# Patient Record
Sex: Male | Born: 1980 | Race: Black or African American | Hispanic: No | Smoking: Current every day smoker
Health system: Southern US, Community
[De-identification: ages and names within clinical notes are randomized; demographics above are authoritative.]

## PROBLEM LIST (undated history)

## (undated) DIAGNOSIS — F17200 Nicotine dependence, unspecified, uncomplicated: Secondary | ICD-10-CM

## (undated) HISTORY — DX: Nicotine dependence, unspecified, uncomplicated: F17.200

---

## 2008-02-28 ENCOUNTER — Emergency Department (HOSPITAL_COMMUNITY): Admission: EM | Admit: 2008-02-28 | Discharge: 2008-02-29 | Payer: Self-pay | Admitting: Emergency Medicine

## 2012-11-10 ENCOUNTER — Encounter (HOSPITAL_COMMUNITY): Payer: Self-pay | Admitting: Emergency Medicine

## 2012-11-10 ENCOUNTER — Emergency Department (HOSPITAL_COMMUNITY)
Admission: EM | Admit: 2012-11-10 | Discharge: 2012-11-10 | Disposition: A | Discharge status: Home / Self Care | Payer: Commercial Managed Care - PPO | Source: Home / Self Care | Attending: Emergency Medicine | Admitting: Emergency Medicine

## 2012-11-10 DIAGNOSIS — L03211 Cellulitis of face: Secondary | ICD-10-CM

## 2012-11-10 DIAGNOSIS — L0201 Cutaneous abscess of face: Secondary | ICD-10-CM

## 2012-11-10 MED ORDER — DOXYCYCLINE HYCLATE 100 MG PO CAPS: 100.0000 mg | ORAL_CAPSULE | Freq: Two times a day (BID) | ORAL | Status: AC

## 2012-11-10 NOTE — ED Notes (Signed)
Pt is here for swelling below left eye/cheek bone since yest Sx include: teary left eye and blurry vision Denies: inj/trauma, pain Has tried cold/warm compressions  He is alert w/no signs of acute distress.

## 2012-11-10 NOTE — ED Provider Notes (Signed)
History     CSN: [id number]  Arrival date & time 11/10/12  1202   First MD Initiated Contact with Patient 11/10/12 1249      Chief Complaint  Patient presents with  . Facial Swelling    (Consider location/radiation/quality/duration/timing/severity/associated sxs/prior treatment) HPI Comments: Patient presents urgent care this afternoon complaining of swelling mild degree of soreness on his left cheek bone underneath his left eye. Patient described that he had a little pimple worse he tried to squeeze yesterday and this morning woke up with swelling and irritation to his left eye. Patient denies any injury, to his eye or any redness or visual changes but did describe that his thigh was somewhat blurry yesterday. ( Not today)  The history is provided by the patient.    History reviewed. No pertinent past medical history.  History reviewed. No pertinent past surgical history.  No family history on file.  History  Substance Use Topics  . Smoking status: Current Every Day Smoker -- 1.00 packs/day    Types: Cigarettes  . Smokeless tobacco: Not on file  . Alcohol Use: Yes      Review of Systems  Constitutional: Negative for chills, diaphoresis, activity change, appetite change and fatigue.  Eyes: Negative for photophobia, pain, discharge, redness, itching and visual disturbance.  Skin: Positive for color change. Negative for rash.    Allergies  Review of patient's allergies indicates no known allergies.  Home Medications   Current Outpatient Rx  Name  Route  Sig  Dispense  Refill  . doxycycline (VIBRAMYCIN) 100 MG capsule   Oral   Take 1 capsule (100 mg total) by mouth 2 (two) times daily.   20 capsule   0     BP 116/80  Pulse 90  Temp(Src) 98.3 F (36.8 C) (Oral)  SpO2 100%  Physical Exam  Nursing note and vitals reviewed. Constitutional: He appears well-developed.  HENT:  Head: Normocephalic.    Mouth/Throat: No oropharyngeal exudate.  Eyes:  Conjunctivae and EOM are normal. Pupils are equal, round, and reactive to light. Right eye exhibits no chemosis, no discharge, no exudate and no hordeolum. No foreign body present in the right eye. Left eye exhibits no chemosis, no discharge, no exudate and no hordeolum. No foreign body present in the left eye. Right conjunctiva has no hemorrhage. Left conjunctiva is not injected. Left conjunctiva has no hemorrhage. No scleral icterus.  Neck: Neck supple. No JVD present.  Neurological: He is alert.  Skin: There is erythema.    ED Course  Procedures (including critical care time)  Labs Reviewed - No data to display No results found.   1. Facial cellulitis       MDM  Facial cellulitis secondary to initial , facial comedon- will start patient on antibiotics with doxycycline have encouraged him to return next 48-72 hours if no significant improvement. If worsening swelling or around his left eye despite antibiotics should go to the emergency department. Patient understands and agrees with treatment plan and followup as necessary.        Jimmie Molly, MD 11/10/12 [phone]

## 2017-03-03 ENCOUNTER — Encounter (HOSPITAL_COMMUNITY): Payer: Self-pay | Admitting: Emergency Medicine

## 2017-03-03 ENCOUNTER — Ambulatory Visit (HOSPITAL_COMMUNITY)
Admission: EM | Admit: 2017-03-03 | Discharge: 2017-03-03 | Disposition: A | Discharge status: Home / Self Care | Payer: Commercial Managed Care - PPO | Attending: Family Medicine | Admitting: Family Medicine

## 2017-03-03 DIAGNOSIS — F17210 Nicotine dependence, cigarettes, uncomplicated: Secondary | ICD-10-CM | POA: Diagnosis not present

## 2017-03-03 DIAGNOSIS — L02411 Cutaneous abscess of right axilla: Secondary | ICD-10-CM | POA: Diagnosis not present

## 2017-03-03 MED ORDER — HYDROCODONE-ACETAMINOPHEN 5-325 MG PO TABS: 1.0000 | ORAL_TABLET | ORAL | 0 refills | Status: DC | PRN

## 2017-03-03 MED ORDER — SULFAMETHOXAZOLE-TRIMETHOPRIM 800-160 MG PO TABS: 1.0000 | ORAL_TABLET | Freq: Two times a day (BID) | ORAL | 0 refills | Status: AC

## 2017-03-03 NOTE — ED Triage Notes (Signed)
[facility] a [facility] possible abscess under his right arm x 5 days.

## 2017-03-03 NOTE — ED Provider Notes (Signed)
CSN: [id number]     Arrival date & time 03/03/17  1734 History   First MD Initiated Contact with Patient 03/03/17 1800     Chief Complaint  Patient presents with  . Abscess   (Consider location/radiation/quality/duration/timing/severity/associated sxs/prior Treatment) The history is provided by the patient.  Abscess  Location:  Shoulder/arm Shoulder/arm abscess location:  R axilla Size:  3 cm Abscess quality: draining, painful, redness and warmth   Red streaking: no   Duration:  2 days Progression:  Worsening Pain details:    Quality:  Throbbing and pressure   Severity:  Severe   Duration:  2 days   Timing:  Constant   Progression:  Worsening Chronicity:  New Context: not diabetes, not immunosuppression, not injected drug use, not insect bite/sting and not skin injury   Relieved by:  Nothing Worsened by:  Draining/squeezing Associated symptoms: no anorexia, no fatigue, no fever, no nausea and no vomiting     History reviewed. No pertinent past medical history. History reviewed. No pertinent surgical history. History reviewed. No pertinent family history. Social History  Substance Use Topics  . Smoking status: Current Every Day Smoker    Packs/day: 1.00    Types: Cigarettes  . Smokeless tobacco: Never Used  . Alcohol use Yes    Review of Systems  Constitutional: Negative for chills, fatigue and fever.  HENT: Negative.   Respiratory: Negative.   Cardiovascular: Negative.   Gastrointestinal: Negative for abdominal pain, anorexia, nausea and vomiting.  Musculoskeletal: Negative.   Skin:       abscess  Neurological: Negative.     Allergies  Patient has no known allergies.  Home Medications   Prior to Admission medications   Medication Sig Start Date End Date Taking? Authorizing Provider  HYDROcodone-acetaminophen (NORCO/VICODIN) 5-325 MG tablet Take 1 tablet by mouth every 4 (four) hours as needed. 03/03/17   Dorena Bodo, NP  sulfamethoxazole-trimethoprim  (BACTRIM DS,SEPTRA DS) 800-160 MG tablet Take 1 tablet by mouth 2 (two) times daily. 03/03/17 03/10/17  Dorena Bodo, NP   Meds Ordered and Administered this Visit  Medications - No data to display  BP 128/73 (BP Location: Right Arm)   Pulse 66   Temp 99.2 F (37.3 C) (Oral)   Resp 16   SpO2 100%  No data found.   Physical Exam  Constitutional: He is oriented to person, place, and time. He appears well-developed and well-nourished. No distress.  HENT:  Head: Normocephalic and atraumatic.  Right Ear: External ear normal.  Left Ear: External ear normal.  Eyes: Conjunctivae are normal.  Neck: Normal range of motion.  Cardiovascular: Normal rate and regular rhythm.   Pulmonary/Chest: Effort normal and breath sounds normal.  Neurological: He is alert and oriented to person, place, and time.  Skin: Skin is warm and dry. Capillary refill takes less than 2 seconds. He is not diaphoretic.  Abscess right axilla.  Psychiatric: He has a normal mood and affect. His behavior is normal.  Nursing note and vitals reviewed.   Urgent Care Course     .Marland KitchenIncision and Drainage Date/Time: 03/03/2017 6:40 PM Performed by: Dorena Bodo Authorized by: Mardella Layman   Consent:    Consent obtained:  Verbal   Consent given by:  Patient   Risks discussed:  Bleeding, incomplete drainage, pain and infection   Alternatives discussed:  Alternative treatment and no treatment Location:    Type:  Abscess   Size:  3 cm   Location:  Trunk   Trunk location: right axilla.  Pre-procedure details:    Skin preparation:  Betadine Anesthesia (see MAR for exact dosages):    Anesthesia method:  Local infiltration   Local anesthetic:  Lidocaine 2% w/o epi Procedure type:    Complexity:  Simple Procedure details:    Incision types:  Single straight   Incision depth:  Subcutaneous   Scalpel blade:  11   Wound management:  Probed and deloculated and extensive cleaning   Drainage:  Purulent   Drainage  amount:  Moderate   Wound treatment:  Wound left open Post-procedure details:    Patient tolerance of procedure:  Tolerated well, no immediate complications   (including critical care time)  Labs Review Labs Reviewed  AEROBIC/ANAEROBIC CULTURE (SURGICAL/DEEP WOUND)    Imaging Review No results found.      MDM   1. Abscess of axilla, right    I&D performed in clinic, cultures obtained and sent for testing, patient given prescription for Bactrim, and hydrocodone. Follow-up guidelines and wound care counseling provided. Return to clinic as necessary.  [place] prior to issuing prescription, patient has had no prescriptions for controlled substances in the last 6 months.     Dorena BodoKennard, Dontel Harshberger, NP 03/03/17 [phone]

## 2017-03-03 NOTE — Discharge Instructions (Signed)
Your abscess has been drained here in the clinic today. Cultures have been obtained and sent to the lab for testing. I have started you on Bactrim, take one tablet twice a day for 7 days. I have also prescribed a medicine for pain called hydrocodone, this medicine is a narcotic, it will cause drowsiness, and it is addictive. Do not take more than what is necessary, do not drink alcohol while taking, and do not operate any heavy machinery while taking this medicine. Change your bandages at least once daily and inspect the wound. If you see any signs or symptoms of infection return to clinic as necessary.

## 2017-03-08 LAB — AEROBIC/ANAEROBIC CULTURE W GRAM STAIN (SURGICAL/DEEP WOUND)

## 2017-03-08 LAB — AEROBIC/ANAEROBIC CULTURE (SURGICAL/DEEP WOUND)

## 2018-05-14 ENCOUNTER — Encounter (HOSPITAL_COMMUNITY): Payer: Self-pay | Admitting: *Deleted

## 2018-05-14 ENCOUNTER — Ambulatory Visit (HOSPITAL_COMMUNITY)
Admission: EM | Admit: 2018-05-14 | Discharge: 2018-05-14 | Disposition: A | Discharge status: Home / Self Care | Payer: Commercial Managed Care - PPO | Attending: Internal Medicine | Admitting: Internal Medicine

## 2018-05-14 ENCOUNTER — Other Ambulatory Visit: Payer: Self-pay

## 2018-05-14 DIAGNOSIS — L0201 Cutaneous abscess of face: Secondary | ICD-10-CM | POA: Diagnosis not present

## 2018-05-14 MED ORDER — POVIDONE-IODINE 10 % EX SOLN
CUTANEOUS | Status: AC
  Filled 2018-05-14: qty 118

## 2018-05-14 MED ORDER — DOXYCYCLINE HYCLATE 100 MG PO CAPS
100.0000 mg | ORAL_CAPSULE | Freq: Two times a day (BID) | ORAL | 0 refills | Status: DC
Start: 2018-05-14 — End: 2019-06-22

## 2018-05-14 NOTE — ED Triage Notes (Signed)
Assessment per provider 

## 2018-05-14 NOTE — Discharge Instructions (Signed)
Please begin doxycycline for 10 days ° °Apply warm compresses/hot rags to area with massage to express further drainage especially the first 24-48 hours ° °Return if symptoms returning or not improving  °

## 2018-05-15 NOTE — ED Provider Notes (Signed)
[facility]    CSN: [id number] Arrival date & time: 05/14/18  1726     History   Chief Complaint Chief Complaint  Patient presents with  . Abscess    HPI Jonathon Stone is a 37 y.o. male no contributing past medical history presenting today for evaluation of abscess on his face and axilla.  Patient states that for the past 3 weeks he has had a bump on his left cheek.  He has been doing warm compresses, but states that this spot has continued to increase in size.  It is mildly painful.  He has noted some pustular drainage from this.  He also has multiple areas on both armpits that have began to bother him.  He does state that he has had previous abscesses in his armpits as well as on his face, occasionally requiring I&D, occasionally spontaneous resolution.  Denies any fevers, eye pain, difficulttymoving eyes, difficulty moving jaw, eating and drinking like normal.  HPI  No past medical history on file.  There are no active problems to display for this patient.   No past surgical history on file.     Home Medications    Prior to Admission medications   Medication Sig Start Date End Date Taking? Authorizing Provider  doxycycline (VIBRAMYCIN) 100 MG capsule Take 1 capsule (100 mg total) by mouth 2 (two) times daily. 05/14/18   Sonnie Pawloski, Junius Creamer, PA-C    Family History Family History  Problem Relation Age of Onset  . Healthy Mother   . Heart attack Father   . Cancer Maternal Grandmother   . Cancer Maternal Aunt   . Cancer Paternal Grandmother     Social History Social History   Tobacco Use  . Smoking status: Current Every Day Smoker    Packs/day: 1.00    Types: Cigarettes  . Smokeless tobacco: Never Used  Substance Use Topics  . Alcohol use: Yes  . Drug use: No     Allergies   Patient has no known allergies.   Review of Systems Review of Systems  Constitutional: Negative for fatigue and fever.  Eyes: Negative for redness, itching and visual  disturbance.  Respiratory: Negative for shortness of breath.   Cardiovascular: Negative for chest pain and leg swelling.  Gastrointestinal: Negative for nausea and vomiting.  Musculoskeletal: Negative for arthralgias and myalgias.  Skin: Positive for color change. Negative for rash and wound.  Neurological: Negative for dizziness, syncope, weakness, light-headedness and headaches.     Physical Exam Triage Vital Signs ED Triage Vitals  Enc Vitals Group     BP 05/14/18 1827 112/74     Pulse Rate 05/14/18 1827 70     Resp 05/14/18 1827 16     Temp 05/14/18 1827 98.4 F (36.9 C)     Temp Source 05/14/18 1827 Oral     SpO2 05/14/18 1827 99 %     Weight --      Height --      Head Circumference --      Peak Flow --      Pain Score 05/14/18 1854 0     Pain Loc --      Pain Edu? --      Excl. in GC? --    No data found.  Updated Vital Signs BP 112/74 (BP Location: Right Arm)   Pulse 70   Temp 98.4 F (36.9 C) (Oral)   Resp 16   SpO2 99%   Visual Acuity Right Eye Distance:  Left Eye Distance:   Bilateral Distance:    Right Eye Near:   Left Eye Near:    Bilateral Near:     Physical Exam  Constitutional: He is oriented to person, place, and time. He appears well-developed and well-nourished.  No acute distress  HENT:  Head: Normocephalic and atraumatic.  Nose: Nose normal.  Left maxillary area with 1.5 cm x 1 cm abscess-like lesion, central area appears already opened with central yellowing, mild tenderness to palpation, no pustular drainage expressed with manipulation.  Eyes: Pupils are equal, round, and reactive to light. Conjunctivae and EOM are normal.  Neck: Neck supple.  Cardiovascular: Normal rate.  Pulmonary/Chest: Effort normal. No respiratory distress.  Abdominal: He exhibits no distension.  Musculoskeletal: Normal range of motion.  Neurological: He is alert and oriented to person, place, and time.  Skin: Skin is warm and dry.  Bilateral axilla with  small indurated areas, no fluctuance appreciated  Psychiatric: He has a normal mood and affect.  Nursing note and vitals reviewed.    UC Treatments / Results  Labs (all labs ordered are listed, but only abnormal results are displayed) Labs Reviewed - No data to display  EKG None  Radiology No results found.  Procedures Incision and Drainage Date/Time: 05/15/2018 9:36 AM Performed by: Remiel Corti, Junius Creamer, PA-C Authorized by: Isa Rankin, MD   Consent:    Consent obtained:  Verbal   Consent given by:  Patient   Risks discussed:  Bleeding, incomplete drainage and pain   Alternatives discussed:  No treatment and alternative treatment Location:    Type:  Abscess   Location:  Head   Head location:  Face Pre-procedure details:    Skin preparation:  Betadine Anesthesia (see MAR for exact dosages):    Anesthesia method:  Local infiltration   Local anesthetic:  Lidocaine 2% w/o epi Procedure type:    Complexity:  Simple Procedure details:    Incision types:  Stab incision   Scalpel blade:  11   Drainage amount: No drainage. Post-procedure details:    Patient tolerance of procedure:  Tolerated well, no immediate complications Comments:     no drainage obtained, central area appeared thickened and jellylike and did not have any central drainage.   (including critical care time)  Medications Ordered in UC Medications - No data to display  Initial Impression / Assessment and Plan / UC Course  I have reviewed the triage vital signs and the nursing notes.  Pertinent labs & imaging results that were available during my care of the patient were reviewed by me and considered in my medical decision making (see chart for details).    Abscess on face appears open, I&D attempted, as lesion artery appeared opened, will put on doxycycline to treat lesion on face as well as in the axilla.  Warm compresses/soaks.  Follow-up if lesion is not improving, developing worsening  swelling or pain.Discussed strict return precautions. Patient verbalized understanding and is agreeable with plan.   Final Clinical Impressions(s) / UC Diagnoses   Final diagnoses:  Abscess of face     Discharge Instructions     Please begin doxycycline for 10 days  Apply warm compresses/hot rags to area with massage to express further drainage especially the first 24-48 hours  Return if symptoms returning or not improving   ED Prescriptions    Medication Sig Dispense Auth. Provider   doxycycline (VIBRAMYCIN) 100 MG capsule Take 1 capsule (100 mg total) by mouth 2 (two) times daily.  20 capsule Jahnessa Vanduyn C, PA-C     Controlled Substance Prescriptions [place] Controlled Substance Registry consulted? Not Applicable   Lew DawesWieters, Max Romano C, [place]-C 05/15/18 [phone]

## 2018-05-19 ENCOUNTER — Other Ambulatory Visit: Payer: Self-pay

## 2018-05-19 ENCOUNTER — Encounter (HOSPITAL_COMMUNITY): Payer: Self-pay

## 2018-05-19 ENCOUNTER — Ambulatory Visit (HOSPITAL_COMMUNITY)
Admission: EM | Admit: 2018-05-19 | Discharge: 2018-05-19 | Disposition: A | Discharge status: Home / Self Care | Payer: Commercial Managed Care - PPO | Attending: Emergency Medicine | Admitting: Emergency Medicine

## 2018-05-19 DIAGNOSIS — L03116 Cellulitis of left lower limb: Secondary | ICD-10-CM

## 2018-05-19 MED ORDER — HYDROCODONE-ACETAMINOPHEN 5-325 MG PO TABS: 1.0000 | ORAL_TABLET | Freq: Four times a day (QID) | ORAL | 0 refills | Status: DC | PRN

## 2018-05-19 MED ORDER — CLINDAMYCIN HCL 300 MG PO CAPS: 300.0000 mg | ORAL_CAPSULE | Freq: Three times a day (TID) | ORAL | 0 refills | Status: DC

## 2018-05-19 NOTE — Discharge Instructions (Addendum)
Return here in 2 days for recheck.  Sooner if needed for fever, worsening of pain or symptoms.

## 2018-05-19 NOTE — ED Provider Notes (Signed)
[facility]    CSN: [id number] Arrival date & time: 05/19/18  0848     History   Chief Complaint Chief Complaint  Patient presents with  . Knee Pain    HPI Jonathon Stone is a 37 y.o. male.   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  Patient states he is frequently running up and down the stairs.  Patient states that he had this.that showed up on his knee with significant knee pain yesterday.  Patient states he is taking Aleve without relief.  Denies injury, fall or hearing any sort of sound before onset of discomfort.  Denies fever diaphoresis or significant medical history.  HPI  History reviewed. No pertinent past medical history.  There are no active problems to display for this patient.   History reviewed. No pertinent surgical history.     Home Medications    Prior to Admission medications   Medication Sig Start Date End Date Taking? Authorizing Provider  doxycycline (VIBRAMYCIN) 100 MG capsule Take 1 capsule (100 mg total) by mouth 2 (two) times daily. 05/14/18   Wieters, Junius Creamer, PA-C    Family History Family History  Problem Relation Age of Onset  . Healthy Mother   . Heart attack Father   . Cancer Maternal Grandmother   . Cancer Maternal Aunt   . Cancer Paternal Grandmother     Social History Social History   Tobacco Use  . Smoking status: Current Every Day Smoker    Packs/day: 1.00    Types: Cigarettes  . Smokeless tobacco: Never Used  Substance Use Topics  . Alcohol use: Yes  . Drug use: No     Allergies   Patient has no known allergies.   Review of Systems Review of Systems  Constitutional: Negative.  Negative for fatigue and fever.  HENT: Negative.   Eyes: Negative.   Respiratory: Negative.  Negative for cough and shortness of breath.   Cardiovascular: Negative.   Gastrointestinal: Negative.  Negative for diarrhea, nausea and vomiting.  Endocrine: Negative.   Genitourinary: Negative.   Musculoskeletal: Positive for  arthralgias and gait problem.       L knee pain.   Skin: Negative.  Negative for wound.  Allergic/Immunologic: Negative.   Neurological: Negative for weakness and headaches.  Hematological: Negative.   Psychiatric/Behavioral: Negative.      Physical Exam Triage Vital Signs ED Triage Vitals  Enc Vitals Group     BP 05/19/18 0915 103/73     Pulse Rate 05/19/18 0915 90     Resp 05/19/18 0915 16     Temp 05/19/18 0915 98.7 F (37.1 C)     Temp src --      SpO2 05/19/18 0915 100 %     Weight 05/19/18 0916 125 lb (56.7 kg)     Height --      Head Circumference --      Peak Flow --      Pain Score 05/19/18 0915 6     Pain Loc --      Pain Edu? --      Excl. in GC? --    No data found.  Updated Vital Signs BP 103/73   Pulse 90   Temp 98.7 F (37.1 C)   Resp 16   Wt 125 lb (56.7 kg)   SpO2 100%    Physical Exam  Constitutional: He appears well-developed and well-nourished. No distress.  Cardiovascular: Normal rate, regular rhythm, normal heart sounds and intact distal  pulses. Exam reveals no gallop and no friction rub.  No murmur heard. Pulmonary/Chest: Effort normal and breath sounds normal. No respiratory distress. He has no wheezes. He has no rales. He exhibits no tenderness.  Skin: Skin is warm and dry. Capillary refill takes less than 2 seconds. He is not diaphoretic.  See attached images.  Patient has fluctuant warm area with pustule on L knee cap.   Nursing note and vitals reviewed.       Images of bilateral knees for comparison and R knee.   UC Treatments / Results  Labs (all labs ordered are listed, but only abnormal results are displayed) Labs Reviewed - No data to display  EKG None  Radiology No results found.  Procedures Procedures (including critical care time)  Medications Ordered in UC Medications - No data to display  Initial Impression / Assessment and Plan / UC Course  I have reviewed the triage vital signs and the nursing  notes.  Pertinent labs & imaging results that were available during my care of the patient were reviewed by me and considered in my medical decision making (see chart for details).     Plan of care was discussed with Dr. Hyacinth MeekerMiller.  Pustule was deroofed with a needle.  Unable to drain any fluid from spot.  She experienced significant discomfort with mild palpation.  Concern with infection over the knee joint.  Delayed of I&D due to proximity of kneecap.  Patient started on clindamycin secondary to concern for joint infection.  She is to return here in 2 days for further evaluation or sooner should symptoms fail to improve or worsen.  Final Clinical Impressions(s) / UC Diagnoses   Final diagnoses:  None   Discharge Instructions   None    ED Prescriptions    None     Controlled Substance Prescriptions [place] Controlled Substance Registry consulted? Yes, I have consulted the [place] Controlled Substances Registry for this patient, and feel the risk/benefit ratio today is favorable for proceeding with this prescription for a controlled substance.  The usual and customary discharge instructions and warnings were given.  The patient verbalizes understanding and agrees to plan of care.      Servando Salinaossi, Mccartney Brucks H, NP 05/19/18 1048

## 2018-05-19 NOTE — ED Triage Notes (Signed)
Left knee pain pt states it aches and it's swollen . This started yesterday.

## 2019-06-22 ENCOUNTER — Other Ambulatory Visit: Payer: Self-pay

## 2019-06-22 ENCOUNTER — Encounter (HOSPITAL_COMMUNITY): Payer: Self-pay

## 2019-06-22 ENCOUNTER — Ambulatory Visit (HOSPITAL_COMMUNITY)
Admission: EM | Admit: 2019-06-22 | Discharge: 2019-06-22 | Disposition: A | Discharge status: Home / Self Care | Payer: 59 | Attending: Urgent Care | Admitting: Urgent Care

## 2019-06-22 DIAGNOSIS — N50811 Right testicular pain: Secondary | ICD-10-CM | POA: Insufficient documentation

## 2019-06-22 DIAGNOSIS — N451 Epididymitis: Secondary | ICD-10-CM | POA: Diagnosis not present

## 2019-06-22 DIAGNOSIS — Z113 Encounter for screening for infections with a predominantly sexual mode of transmission: Secondary | ICD-10-CM

## 2019-06-22 LAB — POCT URINALYSIS DIP (DEVICE)
Glucose, UA: NEGATIVE mg/dL
Hgb urine dipstick: NEGATIVE
Ketones, ur: NEGATIVE mg/dL
Leukocytes,Ua: NEGATIVE
Nitrite: NEGATIVE
Protein, ur: 30 mg/dL — AB
Specific Gravity, Urine: 1.02 (ref 1.005–1.030)
Urobilinogen, UA: 1 mg/dL (ref 0.0–1.0)
pH: 8.5 — ABNORMAL HIGH (ref 5.0–8.0)

## 2019-06-22 MED ORDER — NAPROXEN 500 MG PO TABS: 500.0000 mg | ORAL_TABLET | Freq: Two times a day (BID) | ORAL | 0 refills | Status: DC

## 2019-06-22 MED ORDER — LEVOFLOXACIN 500 MG PO TABS: 500.0000 mg | ORAL_TABLET | Freq: Every day | ORAL | 0 refills | Status: DC

## 2019-06-22 MED ORDER — KETOROLAC TROMETHAMINE 60 MG/2ML IM SOLN
INTRAMUSCULAR | Status: AC
  Filled 2019-06-22: qty 2

## 2019-06-22 MED ORDER — KETOROLAC TROMETHAMINE 60 MG/2ML IM SOLN
60.0000 mg | Freq: Once | INTRAMUSCULAR | Status: AC
  Administered 2019-06-22: 60 mg via INTRAMUSCULAR

## 2019-06-22 NOTE — ED Triage Notes (Signed)
Pt states he has groin pain. Pt states he has not pulled a muscle. Pt states this started yesterday.

## 2019-06-22 NOTE — ED Provider Notes (Signed)
  MRN: [id number] DOB: 08/09/1981  Subjective:   Jonathon Stone is a 38 y.o. male presenting for 1 day hx acute onset persistent moderate-severe sharp pain of posterior right testicle. Has felt intermittent swelling. Denies fever, trauma, heavy lifting, penile discharge, dysuria, hematuria, n/v, abdominal pain. Patient is sexually active, uses condoms for protection, has 72 male sex partner, has monogamous relationship to the best of his knowledge.  Denies taking chronic medications.   No Known Allergies  Denies pmh, psh.   ROS  Objective:   Vitals: BP 106/80 (BP Location: Right Arm)   Pulse 75   Temp 98.5 F (36.9 C) (Oral)   Resp 16   Wt 125 lb (56.7 kg)   SpO2 100%   Physical Exam Constitutional:      Appearance: Normal appearance. He is well-developed and normal weight.  HENT:     Head: Normocephalic and atraumatic.     Right Ear: External ear normal.     Left Ear: External ear normal.     Nose: Nose normal.     Mouth/Throat:     Pharynx: Oropharynx is clear.  Eyes:     Extraocular Movements: Extraocular movements intact.     Pupils: Pupils are equal, round, and reactive to light.  Cardiovascular:     Rate and Rhythm: Normal rate.  Pulmonary:     Effort: Pulmonary effort is normal.  Genitourinary:    Penis: No phimosis, paraphimosis, hypospadias, erythema, tenderness, discharge, swelling or lesions.      Epididymis:     Right: Inflamed. Not enlarged. Tenderness present. No mass.     Left: Not inflamed or enlarged. No mass or tenderness.  Lymphadenopathy:     Lower Body: No right inguinal adenopathy. No left inguinal adenopathy.  Neurological:     Mental Status: He is alert and oriented to person, place, and time.  Psychiatric:        Mood and Affect: Mood normal.        Behavior: Behavior normal.     Results for orders placed or performed during the hospital encounter of 06/22/19 (from the past 24 hour(s))  POCT urinalysis dip (device)     Status: Abnormal    Collection Time: 06/22/19 10:38 AM  Result Value Ref Range   Glucose, UA NEGATIVE NEGATIVE mg/dL   Bilirubin Urine SMALL (A) NEGATIVE   Ketones, ur NEGATIVE NEGATIVE mg/dL   Specific Gravity, Urine 1.020 1.005 - 1.030   Hgb urine dipstick NEGATIVE NEGATIVE   pH 8.5 (H) 5.0 - 8.0   Protein, ur 30 (A) NEGATIVE mg/dL   Urobilinogen, UA 1.0 0.0 - 1.0 mg/dL   Nitrite NEGATIVE NEGATIVE   Leukocytes,Ua NEGATIVE NEGATIVE     Assessment and Plan :   1. Epididymitis   2. Testicular pain, right     Patient and I discussed possibility of STI but he insisted that he is in a monogamous relationship.  Will cover for epididymitis with levofloxacin x10 days.  Labs pending.  Patient is to keep close follow-up especially if he is not having any kind of improvement in the next couple of days.  Counseled patient on potential for adverse effects with medications prescribed/recommended today, ER and return-to-clinic precautions discussed, patient verbalized understanding.    Jaynee Eagles, PA-C 06/22/19 1114

## 2019-06-23 LAB — CYTOLOGY, (ORAL, ANAL, URETHRAL) ANCILLARY ONLY
Chlamydia: NEGATIVE
Neisseria Gonorrhea: NEGATIVE
Trichomonas: POSITIVE — AB

## 2019-06-23 LAB — URINE CULTURE: Culture: <10000 — AB

## 2019-06-26 ENCOUNTER — Telehealth (HOSPITAL_COMMUNITY): Payer: Self-pay | Admitting: Emergency Medicine

## 2019-06-26 MED ORDER — METRONIDAZOLE 500 MG PO TABS: 2000.0000 mg | ORAL_TABLET | Freq: Once | ORAL | 0 refills | Status: AC

## 2019-06-26 NOTE — Telephone Encounter (Signed)
Trichomonas is positive. Rx  for Flagyl 2 grams, once was sent to the pharmacy of record. Pt needs education to refrain from sexual intercourse for 7 days to give the medicine time to work. Sexual partners need to be notified and tested/treated. Condoms may reduce risk of reinfection. Recheck for further evaluation if symptoms are not improving.   Patient contacted and made aware of    results, all questions answered    

## 2020-09-21 DIAGNOSIS — L0291 Cutaneous abscess, unspecified: Secondary | ICD-10-CM

## 2020-09-21 HISTORY — DX: Cutaneous abscess, unspecified: L02.91

## 2020-10-12 ENCOUNTER — Other Ambulatory Visit: Payer: Self-pay

## 2020-10-12 ENCOUNTER — Encounter: Payer: Self-pay | Admitting: Emergency Medicine

## 2020-10-12 ENCOUNTER — Ambulatory Visit
Admission: EM | Admit: 2020-10-12 | Discharge: 2020-10-12 | Disposition: A | Discharge status: Home / Self Care | Payer: 59 | Attending: Emergency Medicine | Admitting: Emergency Medicine

## 2020-10-12 DIAGNOSIS — L0231 Cutaneous abscess of buttock: Secondary | ICD-10-CM

## 2020-10-12 MED ORDER — IBUPROFEN 800 MG PO TABS: 800.0000 mg | ORAL_TABLET | Freq: Three times a day (TID) | ORAL | 0 refills | Status: DC

## 2020-10-12 MED ORDER — DOXYCYCLINE HYCLATE 100 MG PO CAPS: 100.0000 mg | ORAL_CAPSULE | Freq: Two times a day (BID) | ORAL | 0 refills | Status: DC

## 2020-10-12 NOTE — ED Triage Notes (Signed)
Pt presents with left buttock pain x 9 days. He feels he might have been bit by a spider.

## 2020-10-12 NOTE — Discharge Instructions (Signed)
Please begin doxycycline for 10 days  Apply warm compresses/hot rags to area with massage to express further drainage especially the first 24-48 hours  Return in 2-3 days if not improving  Return if symptoms returning or not improving

## 2020-10-12 NOTE — ED Provider Notes (Signed)
[facility]    CSN: [id number] Arrival date & time: 10/12/20  1401      History   Chief Complaint Chief Complaint  Patient presents with  . Abscess    Left buttock    HPI Jonathon Stone is a 40 y.o. male presenting today for evaluation of left buttock pain.  Reports approximately 2 weeks ago was lying on floor and felt a sharp pinch in his left buttocks.  Since he has developed an area of pain swelling and redness to his left buttocks.  Reports some occasional bloody drainage.  A lot of pain with pressure on this side.  Denies fevers.  Denies history of similar.  HPI  History reviewed. No pertinent past medical history.  There are no problems to display for this patient.   History reviewed. No pertinent surgical history.     Home Medications    Prior to Admission medications   Medication Sig Start Date End Date Taking? Authorizing Provider  doxycycline (VIBRAMYCIN) 100 MG capsule Take 1 capsule (100 mg total) by mouth 2 (two) times daily for 10 days. 10/12/20 10/22/20 Yes Rosan Calbert C, PA-C  ibuprofen (ADVIL) 800 MG tablet Take 1 tablet (800 mg total) by mouth 3 (three) times daily. 10/12/20  Yes Suesan Mohrmann, Junius Creamer, PA-C    Family History Family History  Problem Relation Age of Onset  . Healthy Mother   . Heart attack Father   . Cancer Maternal Grandmother   . Cancer Maternal Aunt   . Cancer Paternal Grandmother     Social History Social History   Tobacco Use  . Smoking status: Current Every Day Smoker    Packs/day: 1.00    Types: Cigarettes  . Smokeless tobacco: Never Used  Vaping Use  . Vaping Use: Never used  Substance Use Topics  . Alcohol use: Yes  . Drug use: No     Allergies   Patient has no known allergies.   Review of Systems Review of Systems  Constitutional: Negative for fatigue and fever.  Eyes: Negative for redness, itching and visual disturbance.  Respiratory: Negative for shortness of breath.   Cardiovascular:  Negative for chest pain and leg swelling.  Gastrointestinal: Negative for nausea and vomiting.  Musculoskeletal: Negative for arthralgias and myalgias.  Skin: Positive for color change and wound. Negative for rash.  Neurological: Negative for dizziness, syncope, weakness, light-headedness and headaches.     Physical Exam Triage Vital Signs ED Triage Vitals  Enc Vitals Group     BP 10/12/20 1417 112/76     Pulse Rate 10/12/20 1417 71     Resp 10/12/20 1417 16     Temp 10/12/20 1417 98.3 F (36.8 C)     Temp Source 10/12/20 1417 Oral     SpO2 10/12/20 1417 98 %     Weight 10/12/20 1420 125 lb (56.7 kg)     Height 10/12/20 1420 5\' 9"  (1.753 m)     Head Circumference --      Peak Flow --      Pain Score 10/12/20 1420 2     Pain Loc --      Pain Edu? --      Excl. in GC? --    No data found.  Updated Vital Signs BP 112/76 (BP Location: Left Arm)   Pulse 71   Temp 98.3 F (36.8 C) (Oral)   Resp 16   Ht 5\' 9"  (1.753 m)   Wt 125 lb (56.7 kg)  SpO2 97%   BMI 18.46 kg/m   Visual Acuity Right Eye Distance:   Left Eye Distance:   Bilateral Distance:    Right Eye Near:   Left Eye Near:    Bilateral Near:     Physical Exam Vitals and nursing note reviewed.  Constitutional:      Appearance: He is well-developed and well-nourished.     Comments: No acute distress  HENT:     Head: Normocephalic and atraumatic.     Nose: Nose normal.  Eyes:     Conjunctiva/sclera: Conjunctivae normal.  Cardiovascular:     Rate and Rhythm: Normal rate.  Pulmonary:     Effort: Pulmonary effort is normal. No respiratory distress.  Abdominal:     General: There is no distension.  Musculoskeletal:        General: Normal range of motion.     Cervical back: Neck supple.  Skin:    General: Skin is warm and dry.     Comments: Left buttocks with 2 to 3 cm area of erythema and induration with central scabbing, no fluctuance noted, tenderness to palpation around area  Neurological:      Mental Status: He is alert and oriented to person, place, and time.  Psychiatric:        Mood and Affect: Mood and affect normal.      UC Treatments / Results  Labs (all labs ordered are listed, but only abnormal results are displayed) Labs Reviewed - No data to display  EKG   Radiology No results found.  Procedures Procedures (including critical care time)  Medications Ordered in UC Medications - No data to display  Initial Impression / Assessment and Plan / UC Course  I have reviewed the triage vital signs and the nursing notes.  Pertinent labs & imaging results that were available during my care of the patient were reviewed by me and considered in my medical decision making (see chart for details).     Left buttock abscess- initiated on doxycycline, warm compresses, no fluctuance noted and given central scabbing deferring I&D at this time.  Monitor for gradual resolution with antibiotics alone.  Follow-up in 2 to 3 days for I&D if not resolving.  Discussed strict return precautions. Patient verbalized understanding and is agreeable with plan.  Final Clinical Impressions(s) / UC Diagnoses   Final diagnoses:  Left buttock abscess     Discharge Instructions     Please begin doxycycline for 10 days  Apply warm compresses/hot rags to area with massage to express further drainage especially the first 24-48 hours  Return in 2-3 days if not improving  Return if symptoms returning or not improving    ED Prescriptions    Medication Sig Dispense Auth. Provider   doxycycline (VIBRAMYCIN) 100 MG capsule Take 1 capsule (100 mg total) by mouth 2 (two) times daily for 10 days. 20 capsule Byran Bilotti C, PA-C   ibuprofen (ADVIL) 800 MG tablet Take 1 tablet (800 mg total) by mouth 3 (three) times daily. 21 tablet Elize Pinon, [place] C, PA-C     PDMP not reviewed this encounter.   Lew Dawes, [place] 10/12/20 1652

## 2020-10-16 ENCOUNTER — Encounter (HOSPITAL_COMMUNITY): Payer: Self-pay | Admitting: Emergency Medicine

## 2020-10-16 ENCOUNTER — Emergency Department (HOSPITAL_COMMUNITY): Payer: 59

## 2020-10-16 ENCOUNTER — Emergency Department (HOSPITAL_COMMUNITY)
Admission: EM | Admit: 2020-10-16 | Discharge: 2020-10-16 | Disposition: A | Discharge status: Home / Self Care | Payer: 59 | Attending: Emergency Medicine | Admitting: Emergency Medicine

## 2020-10-16 DIAGNOSIS — F17210 Nicotine dependence, cigarettes, uncomplicated: Secondary | ICD-10-CM | POA: Insufficient documentation

## 2020-10-16 DIAGNOSIS — L0231 Cutaneous abscess of buttock: Secondary | ICD-10-CM | POA: Diagnosis present

## 2020-10-16 LAB — BASIC METABOLIC PANEL
Anion gap: 9 (ref 5–15)
BUN: 5 mg/dL — ABNORMAL LOW (ref 6–20)
CO2: 26 mmol/L (ref 22–32)
Calcium: 8.8 mg/dL — ABNORMAL LOW (ref 8.9–10.3)
Chloride: 105 mmol/L (ref 98–111)
Creatinine, Ser: 0.72 mg/dL (ref 0.61–1.24)
GFR, Estimated: >60 mL/min (ref >60)
Glucose, Bld: 89 mg/dL (ref 70–99)
Potassium: 3.7 mmol/L (ref 3.5–5.1)
Sodium: 140 mmol/L (ref 135–145)

## 2020-10-16 LAB — CBC WITH DIFFERENTIAL/PLATELET
Abs Immature Granulocytes: 0.05 10*3/uL (ref 0.00–0.07)
Basophils Absolute: 0.1 10*3/uL (ref 0.0–0.1)
Basophils Relative: 0 %
Eosinophils Absolute: 0.3 10*3/uL (ref 0.0–0.5)
Eosinophils Relative: 2 %
HCT: 35.8 % — ABNORMAL LOW (ref 39.0–52.0)
Hemoglobin: 11.3 g/dL — ABNORMAL LOW (ref 13.0–17.0)
Immature Granulocytes: 0 %
Lymphocytes Relative: 28 %
Lymphs Abs: 4.4 10*3/uL — ABNORMAL HIGH (ref 0.7–4.0)
MCH: 27.5 pg (ref 26.0–34.0)
MCHC: 31.6 g/dL (ref 30.0–36.0)
MCV: 87.1 fL (ref 80.0–100.0)
Monocytes Absolute: 2 10*3/uL — ABNORMAL HIGH (ref 0.1–1.0)
Monocytes Relative: 13 %
Neutro Abs: 8.8 10*3/uL — ABNORMAL HIGH (ref 1.7–7.7)
Neutrophils Relative %: 57 %
Platelets: 450 10*3/uL — ABNORMAL HIGH (ref 150–400)
RBC: 4.11 MIL/uL — ABNORMAL LOW (ref 4.22–5.81)
RDW: 15.2 % (ref 11.5–15.5)
WBC: 15.6 10*3/uL — ABNORMAL HIGH (ref 4.0–10.5)
nRBC: 0 % (ref 0.0–0.2)

## 2020-10-16 MED ORDER — MORPHINE SULFATE (PF) 4 MG/ML IV SOLN
4.0000 mg | Freq: Once | INTRAVENOUS | Status: AC
  Administered 2020-10-16: 4 mg via INTRAVENOUS
  Filled 2020-10-16: qty 1

## 2020-10-16 MED ORDER — SULFAMETHOXAZOLE-TRIMETHOPRIM 800-160 MG PO TABS: 1.0000 | ORAL_TABLET | Freq: Two times a day (BID) | ORAL | 0 refills | Status: DC

## 2020-10-16 MED ORDER — IOHEXOL 300 MG/ML  SOLN
100.0000 mL | Freq: Once | INTRAMUSCULAR | Status: AC | PRN
  Administered 2020-10-16: 100 mL via INTRAVENOUS

## 2020-10-16 MED ORDER — HYDROCODONE-ACETAMINOPHEN 5-325 MG PO TABS: 1.0000 | ORAL_TABLET | Freq: Four times a day (QID) | ORAL | 0 refills | Status: DC | PRN

## 2020-10-16 NOTE — ED Triage Notes (Signed)
Patient here with abscess on buttock for approximately one week. Seen at [facility] urgent care on Saturday, prescribed doxycycline and instructed to apply warm compresses. Patient says abscess has gotten larger and more painful.

## 2020-10-16 NOTE — Procedures (Signed)
Incision and Drainage Procedure Note  Pre-operative Diagnosis: left gluteal abscess  Post-operative Diagnosis: same  Indications: left gluteal abscess with overlying skin necrosis and purulent draiange  Anesthesia: 1% plain lidocaine  Procedure Details  The procedure, risks and complications have been discussed in detail (including, but not limited to airway compromise, infection, bleeding) with the patient, and the patient has signed consent to the procedure.  The skin was sterilely prepped and draped over the affected area in the usual fashion. After adequate local anesthesia, I&D with a #11 blade was performed on the left gluteal abscess measuring about 4 cm in diameter. Purulent drainage: present. Wound was probed and loculations broken up. Irrigated with copious sterile saline. Packed with iodoform packing strip and covered with dry dressing.  The patient was observed until stable.  Findings: Purulent drainage and loculated abscess cavity  EBL: <5 cc's  Drains: None   Condition: Tolerated procedure well and Stable   Complications: none.  Patient to remove packing in 48 hrs. Finish current Rx for doxycycline. Cultures obtained. Follow up in the office Friday.   Juliet Rude, [facility] 10/16/2020, 4:41 PM Please see Amion for pager number during day hours 7:00am-4:30pm

## 2020-10-16 NOTE — Discharge Instructions (Signed)
Continue taking doxycycline Continue using warm compresses Keep packing in place and follow up in our office Friday to have this removed and wound checked. Ok to change overlying bandages as needed for saturation. Continue using Tylenol and ibuprofen as needed for mild to moderate pain.  Use Norco as needed for severe breakthrough pain.  Have caution, this may make you tired or groggy.  Do not drive or operate heavy machinery while taking this medicine. Return to the emergency room if you develop high fevers, persistent vomiting, confusion/weakness, any new, worsening, or concerning symptoms.

## 2020-10-16 NOTE — ED Provider Notes (Addendum)
[facility] EMERGENCY DEPARTMENT Provider Note   CSN: [id number] Arrival date & time: 10/16/20  3428     History Chief Complaint  Patient presents with  . Abscess    Jonathon Stone is a 40 y.o. male presenting for evaluation of abscess.  Patient states last week he has had a nice result of his left buttock/hip.  He was seen in urgent care 2 days ago, started on doxycycline and using warm compresses.  He states the area has gotten larger and more painful.  No fevers or chills.  No nausea or vomiting, weakness, confusion.  He has no other medical problems, takes no medications daily.  No previous history of abscesses or boils.    HPI     History reviewed. No pertinent past medical history.  There are no problems to display for this patient.   History reviewed. No pertinent surgical history.     Family History  Problem Relation Age of Onset  . Healthy Mother   . Heart attack Father   . Cancer Maternal Grandmother   . Cancer Maternal Aunt   . Cancer Paternal Grandmother     Social History   Tobacco Use  . Smoking status: Current Every Day Smoker    Packs/day: 1.00    Types: Cigarettes  . Smokeless tobacco: Never Used  Vaping Use  . Vaping Use: Never used  Substance Use Topics  . Alcohol use: Yes  . Drug use: No    Home Medications Prior to Admission medications   Medication Sig Start Date End Date Taking? Authorizing Provider  doxycycline (VIBRAMYCIN) 100 MG capsule Take 1 capsule (100 mg total) by mouth 2 (two) times daily for 10 days. 10/12/20 10/22/20  Stone, Jonathon C, PA-C  ibuprofen (ADVIL) 800 MG tablet Take 1 tablet (800 mg total) by mouth 3 (three) times daily. 10/12/20   Stone, Jonathon C, PA-C    Allergies    Patient has no known allergies.  Review of Systems   Review of Systems  Skin: Positive for wound.  All other systems reviewed and are negative.   Physical Exam Updated Vital Signs BP 132/89 (BP Location: Right Arm)    Pulse 89   Temp 97.6 F (36.4 C) (Oral)   Resp 18   SpO2 100%   Physical Exam Vitals and nursing note reviewed.  Constitutional:      General: He is not in acute distress.    Appearance: He is well-developed and well-nourished.     Comments: Appears nontoxic  HENT:     Head: Normocephalic and atraumatic.  Eyes:     Extraocular Movements: Extraocular movements intact and EOM normal.     Conjunctiva/sclera: Conjunctivae normal.     Pupils: Pupils are equal, round, and reactive to light.  Cardiovascular:     Rate and Rhythm: Normal rate and regular rhythm.     Pulses: Normal pulses and intact distal pulses.  Pulmonary:     Effort: Pulmonary effort is normal. No respiratory distress.     Breath sounds: Normal breath sounds. No wheezing.  Abdominal:     General: There is no distension.     Palpations: Abdomen is soft. There is no mass.     Tenderness: There is no abdominal tenderness. There is no guarding or rebound.  Musculoskeletal:        General: Normal range of motion.     Cervical back: Normal range of motion and neck supple.       Back:  Comments: Large abscess to the left lateral buttock with induration and tenderness.  Minimal drainage. Central ulceration  Skin:    General: Skin is warm and dry.     Capillary Refill: Capillary refill takes less than 2 seconds.  Neurological:     Mental Status: He is alert and oriented to person, place, and time.  Psychiatric:        Mood and Affect: Mood and affect normal.        ED Results / Procedures / Treatments   Labs (all labs ordered are listed, but only abnormal results are displayed) Labs Reviewed  CBC WITH DIFFERENTIAL/PLATELET - Abnormal; Notable for the following components:      Result Value   WBC 15.6 (*)    RBC 4.11 (*)    Hemoglobin 11.3 (*)    HCT 35.8 (*)    Platelets 450 (*)    Neutro Abs 8.8 (*)    Lymphs Abs 4.4 (*)    Monocytes Absolute 2.0 (*)    All other components within normal limits   BASIC METABOLIC PANEL - Abnormal; Notable for the following components:   BUN 5 (*)    Calcium 8.8 (*)    All other components within normal limits    EKG None  Radiology No results found.  Procedures Procedures   Medications Ordered in ED Medications  morphine 4 MG/ML injection 4 mg (4 mg Intravenous Given 10/16/20 1255)    ED Course  I have reviewed the triage vital signs and the nursing notes.  Pertinent labs & imaging results that were available during my care of the patient were reviewed by me and considered in my medical decision making (see chart for details).    MDM Rules/Calculators/A&Jonathon                          Patient presenting for evaluation of abscess.  On exam, patient appears nontoxic.  No systemic signs of infection including fever, hypotension, tachycardia.  On exam, patient has a large wound of the left lateral buttock.  Bedside ultrasound does not demonstrate a large drainable area or significant fluid collection.  However due to the size and central ulceration, I am concerned about healing of this wound.  Discussed with general surgery PA, Jonathon Stone,  who states they can see him in follow-up in 2 days.  Requests CT to ensure no underlying drainable fluid collection prior to discharge. Case discussed with attending, Jonathon. Wilkie Stone evaluated the pt.   Labs show mildly elevated white count of 15, unsurprising the setting of infection.  CT pending.  CT shows large superficial fluid collection.  Will consult general surgery.  Discussed with Jonathon Hector, PA-C from general surgery, who will come and evaluate the patient  Final Clinical Impression(s) / ED Diagnoses Final diagnoses:  Abscess of buttock, left    Rx / DC Orders ED Discharge Orders    None         Jonathon Apley, PA-C 10/16/20 1555    Jonathon, Clabe Seal, DO 10/17/20 0825

## 2020-10-16 NOTE — Consult Note (Signed)
Jonathon Stone 25-Apr-1981  [id number].    Requesting MD: Alveria Apley, PA-C (attending, Dr. Wilkie Aye) Chief Complaint/Reason for Consult: Abscess  HPI: Jonathon Stone is a 40 y.o. male with no significant past medical history who presented to [facility] ED today for left buttock wound/abscess.  Patient reports approximately 2 weeks ago he was lying on the floor when he felt a sharp pinch in his left buttocks.  He noted in the days following he developed an area of swelling and redness to his left buttocks with occasional drainage.  States that the area feels warm at times, making him feel warm/ subjective fever and subsequently vomit. He was seen at urgent care on 1/22 and was started on doxycycline and recommended to perform warm compresses at home.  He has been compliant with warm compresses at home without any relief.  He notes the area has gotten larger and more painful, and has been draining more for about 2 days. N/v has resolved and he is tolerating a diet.   In the ED patient is afebrile without tachycardia, or hypotension.  WBC 15.6.  CT obtained that showed cellulitis of the left gluteal region and proximal left thigh with subcutaneous buttock abscess measuring 4.5 x 3.1 x 4.0cm.  General surgery was asked to see.  ROS: Review of Systems  Constitutional: Positive for chills. Negative for fever.  Respiratory: Negative for cough and shortness of breath.   Cardiovascular: Negative for chest pain.  Gastrointestinal: Positive for nausea and vomiting. Negative for abdominal pain, constipation and diarrhea.  Genitourinary: Negative for dysuria.  Skin:       Left buttock wound/abscess  All other systems reviewed and are negative.  Social Hx: Patient is a 1PPD smoker He does drink alcohol No illicit drug use He works at [employer] Patient is Married  Family History  Problem Relation Age of Onset  . Healthy Mother   . Heart attack Father   . Cancer Maternal Grandmother   .  Cancer Maternal Aunt   . Cancer Paternal Grandmother     History reviewed. No pertinent past medical history.  History reviewed. No pertinent surgical history.  Social History:  reports that he has been smoking cigarettes. He has been smoking about 1.00 pack per day. He has never used smokeless tobacco. He reports current alcohol use. He reports that he does not use drugs.   Allergies: No Known Allergies  (Not in a hospital admission)    Physical Exam: Blood pressure 102/67, pulse (!) 56, temperature 97.6 F (36.4 C), temperature source Oral, resp. rate 20, SpO2 99 %. General: pleasant, WD/WN male who is laying in bed in NAD HEENT: head is normocephalic, atraumatic.  Sclera are noninjected.  PERRL.  Ears and nose without any masses or lesions.  Mouth is pink and moist. Dentition fair Heart: regular, rate, and rhythm.  Normal s1,s2. No obvious murmurs, gallops, or rubs noted.  Palpable pedal pulses bilaterally  Lungs: CTAB, no wheezes, rhonchi, or rales noted.  Respiratory effort nonlabored Abd: Soft, NT/ND, +BS, no masses, hernias, or organomegaly MS: No BUE/BLE edema, calves soft and nontender Psych: A&Ox4 with an appropriate affect Neuro: cranial nerves grossly intact, equal strength in BUE/BLE bilaterally, normal speech, thought process intact. Gait not assessed Skin: Patient with area of tense fluctuance to the left buttock/hip with overlying skin changes as noted below. No active drainage. No surrounding erythema or heat. Skin is otherwise warm and dry with no masses, lesions, or rashes  Results for orders placed or performed during the hospital encounter of 10/16/20 (from the past 48 hour(s))  CBC with Differential     Status: Abnormal   Collection Time: 10/16/20  1:23 PM  Result Value Ref Range   WBC 15.6 (H) 4.0 - 10.5 K/uL   RBC 4.11 (L) 4.22 - 5.81 MIL/uL   Hemoglobin 11.3 (L) 13.0 - 17.0 g/dL   HCT 84.1 (L) 66.0 - 63.0 %   MCV 87.1 80.0 - 100.0 fL   MCH 27.5  26.0 - 34.0 pg   MCHC 31.6 30.0 - 36.0 g/dL   RDW 16.0 10.9 - 32.3 %   Platelets 450 (H) 150 - 400 K/uL   nRBC 0.0 0.0 - 0.2 %   Neutrophils Relative % 57 %   Neutro Abs 8.8 (H) 1.7 - 7.7 K/uL   Lymphocytes Relative 28 %   Lymphs Abs 4.4 (H) 0.7 - 4.0 K/uL   Monocytes Relative 13 %   Monocytes Absolute 2.0 (H) 0.1 - 1.0 K/uL   Eosinophils Relative 2 %   Eosinophils Absolute 0.3 0.0 - 0.5 K/uL   Basophils Relative 0 %   Basophils Absolute 0.1 0.0 - 0.1 K/uL   Immature Granulocytes 0 %   Abs Immature Granulocytes 0.05 0.00 - 0.07 K/uL    Comment: Performed at [facility], [address] [place], [place] [address]  Basic metabolic panel     Status: Abnormal   Collection Time: 10/16/20  1:23 PM  Result Value Ref Range   Sodium 140 135 - 145 mmol/L   Potassium 3.7 3.5 - 5.1 mmol/L   Chloride 105 98 - 111 mmol/L   CO2 26 22 - 32 mmol/L   Glucose, Bld 89 70 - 99 mg/dL    Comment: Glucose reference range applies only to samples taken after fasting for at least 8 hours.   BUN 5 (L) 6 - 20 mg/dL   Creatinine, Ser 2.02 0.61 - 1.24 mg/dL   Calcium 8.8 (L) 8.9 - 10.3 mg/dL   GFR, Estimated >54 >27 mL/min    Comment: (NOTE) Calculated using the CKD-EPI Creatinine Equation (2021)    Anion gap 9 5 - 15    Comment: Performed at [facility], [address], [place], [place] [address]   CT PELVIS W CONTRAST  Result Date: 10/16/2020 CLINICAL DATA:  Buttock abscess EXAM: CT PELVIS WITH CONTRAST TECHNIQUE: Multidetector CT imaging of the pelvis was performed using the standard protocol following the bolus administration of intravenous contrast. CONTRAST:  OMNIPAQUE IOHEXOL 300 MG/ML  SOLN COMPARISON:  None. FINDINGS: Urinary Tract:  No abnormality visualized. Bowel:  Unremarkable visualized pelvic bowel loops. Vascular/Lymphatic: No pathologically enlarged lymph nodes. No significant vascular abnormality seen. Reproductive:  No mass or other significant abnormality Other:  Subcutaneous fluid collection within the left posterolateral buttock measures up to 4.5 x 3.1 by 4.0 cm, with peripheral enhancement consistent with abscess. This is located immediately deep to the skin surface, and does not extend into the gluteal musculature. There is surrounding subcutaneous fat stranding consistent with adjacent cellulitis, extending into the posterior upper left thigh. Musculoskeletal: No acute or destructive bony lesions. Reconstructed images demonstrate no additional findings. IMPRESSION: 1. Cellulitis of the left gluteal region and proximal left thigh, with subcutaneous buttock abscess as above. Electronically Signed   By: Sharlet Salina M.D.   On: 10/16/2020 15:33   Anti-infectives (From admission, onward)   Start     Dose/Rate Route Frequency Ordered Stop  10/16/20 0000  sulfamethoxazole-trimethoprim (BACTRIM DS) 800-160 MG tablet        1 tablet Oral 2 times daily 10/16/20 1516 10/23/20 2359      Assessment/Plan Left buttock abscess This is a 40 year old male that presents with 2 weeks of worsening left buttock pain. He was seen in urgent care on 1/22 and prescribed Doxycycline for a buttock abscess. Presented to the ED today for worsening symptoms. In the ED patient is afebrile without tachycardia, or hypotension.  WBC 15.6.  CT obtained that showed cellulitis of the left gluteal region and proximal left thigh with subcutaneous buttock abscess measuring 4.5 x 3.1 x 4.0cm. Will perfom bedside I&D and have follow up in the office Friday to remove packing. Will send culture. Recommend continuing current course of abx. Continue warm compresses. Strict return precautions.   Carlena Bjornstad, PA-C [facility] 10/16/2020, 3:59 PM Please see Amion for pager number during day hours 7:00am-4:30pm

## 2020-10-16 NOTE — ED Provider Notes (Cosign Needed Addendum)
  Physical Exam  BP 102/67   Pulse (!) 56   Temp 97.6 F (36.4 C) (Oral)   Resp 20   SpO2 99%   MDM   1630: Care assumed from previous ED PA at shift change.  See previous notes for full H&P.  Briefly, 40 year old male presents for left buttock abscess.  Has been on doxycycline for the last 2 days.  Previous team consulted general surgery who came down and performed incision and drainage at bedside.  Discussed case with general surgery PAs who recommend discharge with continued doxycycline.  Wound culture ordered. Patient to remove packing on Friday.  General surgery will make appointment for clinic follow-up. Prescription by previous EDPA changed to bactrim, norco ordered as well.   Patient discharged from the ED in good condition.     Liberty Handy, PA-C 10/16/20 1635    Maia Plan, MD 10/18/20 1150

## 2020-10-19 LAB — AEROBIC CULTURE  (SUPERFICIAL SPECIMEN)

## 2020-10-19 LAB — AEROBIC CULTURE W GRAM STAIN (SUPERFICIAL SPECIMEN)

## 2020-10-21 ENCOUNTER — Emergency Department (HOSPITAL_COMMUNITY): Payer: 59 | Admitting: Anesthesiology

## 2020-10-21 ENCOUNTER — Other Ambulatory Visit: Payer: Self-pay

## 2020-10-21 ENCOUNTER — Ambulatory Visit (HOSPITAL_COMMUNITY)
Admission: EM | Admit: 2020-10-21 | Discharge: 2020-10-21 | Disposition: A | Discharge status: Home / Self Care | Payer: 59 | Attending: Emergency Medicine | Admitting: Emergency Medicine

## 2020-10-21 ENCOUNTER — Encounter (HOSPITAL_COMMUNITY): Payer: Self-pay

## 2020-10-21 ENCOUNTER — Encounter (HOSPITAL_COMMUNITY)
Admission: EM | Disposition: A | Discharge status: Home / Self Care | Payer: Self-pay | Source: Home / Self Care | Attending: Emergency Medicine

## 2020-10-21 DIAGNOSIS — F17210 Nicotine dependence, cigarettes, uncomplicated: Secondary | ICD-10-CM | POA: Diagnosis not present

## 2020-10-21 DIAGNOSIS — L0231 Cutaneous abscess of buttock: Secondary | ICD-10-CM | POA: Insufficient documentation

## 2020-10-21 DIAGNOSIS — Z20822 Contact with and (suspected) exposure to covid-19: Secondary | ICD-10-CM | POA: Diagnosis not present

## 2020-10-21 DIAGNOSIS — Z09 Encounter for follow-up examination after completed treatment for conditions other than malignant neoplasm: Secondary | ICD-10-CM

## 2020-10-21 HISTORY — PX: IRRIGATION AND DEBRIDEMENT ABSCESS: SHX5252

## 2020-10-21 LAB — BASIC METABOLIC PANEL
Anion gap: 10 (ref 5–15)
BUN: 9 mg/dL (ref 6–20)
CO2: 22 mmol/L (ref 22–32)
Calcium: 9 mg/dL (ref 8.9–10.3)
Chloride: 105 mmol/L (ref 98–111)
Creatinine, Ser: 0.88 mg/dL (ref 0.61–1.24)
GFR, Estimated: >60 mL/min (ref >60)
Glucose, Bld: 74 mg/dL (ref 70–99)
Potassium: 4 mmol/L (ref 3.5–5.1)
Sodium: 137 mmol/L (ref 135–145)

## 2020-10-21 LAB — CBC
HCT: 40.4 % (ref 39.0–52.0)
Hemoglobin: 12.7 g/dL — ABNORMAL LOW (ref 13.0–17.0)
MCH: 27.4 pg (ref 26.0–34.0)
MCHC: 31.4 g/dL (ref 30.0–36.0)
MCV: 87.1 fL (ref 80.0–100.0)
Platelets: 551 10*3/uL — ABNORMAL HIGH (ref 150–400)
RBC: 4.64 MIL/uL (ref 4.22–5.81)
RDW: 15 % (ref 11.5–15.5)
WBC: 10.5 10*3/uL (ref 4.0–10.5)
nRBC: 0 % (ref 0.0–0.2)

## 2020-10-21 LAB — PROTIME-INR
INR: 1.1 (ref 0.8–1.2)
Prothrombin Time: 13.6 seconds (ref 11.4–15.2)

## 2020-10-21 LAB — SARS CORONAVIRUS 2 BY RT PCR (HOSPITAL ORDER, PERFORMED IN ~~LOC~~ HOSPITAL LAB): SARS Coronavirus 2: NEGATIVE

## 2020-10-21 SURGERY — IRRIGATION AND DEBRIDEMENT ABSCESS
Anesthesia: General | Site: Buttocks | Laterality: Left

## 2020-10-21 MED ORDER — CHLORHEXIDINE GLUCONATE 0.12 % MT SOLN
OROMUCOSAL | Status: AC
  Administered 2020-10-21: 15 mL via OROMUCOSAL
  Filled 2020-10-21: qty 15

## 2020-10-21 MED ORDER — LACTATED RINGERS IV SOLN: INTRAVENOUS | Status: DC

## 2020-10-21 MED ORDER — PROMETHAZINE HCL 25 MG/ML IJ SOLN: 6.2500 mg | INTRAMUSCULAR | Status: DC | PRN

## 2020-10-21 MED ORDER — MIDAZOLAM HCL 5 MG/5ML IJ SOLN
INTRAMUSCULAR | Status: DC | PRN
  Administered 2020-10-21: 2 mg via INTRAVENOUS

## 2020-10-21 MED ORDER — DEXAMETHASONE SODIUM PHOSPHATE 10 MG/ML IJ SOLN
INTRAMUSCULAR | Status: AC
  Filled 2020-10-21: qty 3

## 2020-10-21 MED ORDER — PROPOFOL 10 MG/ML IV BOLUS
INTRAVENOUS | Status: AC
  Filled 2020-10-21: qty 20

## 2020-10-21 MED ORDER — ONDANSETRON HCL 4 MG/2ML IJ SOLN
INTRAMUSCULAR | Status: DC | PRN
  Administered 2020-10-21: 4 mg via INTRAVENOUS

## 2020-10-21 MED ORDER — SCOPOLAMINE 1 MG/3DAYS TD PT72: 1.0000 | MEDICATED_PATCH | TRANSDERMAL | Status: DC

## 2020-10-21 MED ORDER — KETOROLAC TROMETHAMINE 15 MG/ML IJ SOLN: 15.0000 mg | INTRAMUSCULAR | Status: AC

## 2020-10-21 MED ORDER — SCOPOLAMINE 1 MG/3DAYS TD PT72
MEDICATED_PATCH | TRANSDERMAL | Status: AC
  Filled 2020-10-21: qty 1

## 2020-10-21 MED ORDER — FENTANYL CITRATE (PF) 250 MCG/5ML IJ SOLN
INTRAMUSCULAR | Status: DC | PRN
  Administered 2020-10-21: 25 ug via INTRAVENOUS
  Administered 2020-10-21: 50 ug via INTRAVENOUS

## 2020-10-21 MED ORDER — FENTANYL CITRATE (PF) 100 MCG/2ML IJ SOLN
INTRAMUSCULAR | Status: AC
  Filled 2020-10-21: qty 2

## 2020-10-21 MED ORDER — AMOXICILLIN-POT CLAVULANATE 875-125 MG PO TABS: 1.0000 | ORAL_TABLET | Freq: Two times a day (BID) | ORAL | 0 refills | Status: DC

## 2020-10-21 MED ORDER — EPHEDRINE 5 MG/ML INJ
INTRAVENOUS | Status: AC
  Filled 2020-10-21: qty 10

## 2020-10-21 MED ORDER — OXYCODONE HCL 5 MG/5ML PO SOLN: 5.0000 mg | Freq: Once | ORAL | Status: DC | PRN

## 2020-10-21 MED ORDER — SUCCINYLCHOLINE CHLORIDE 200 MG/10ML IV SOSY
PREFILLED_SYRINGE | INTRAVENOUS | Status: AC
  Filled 2020-10-21: qty 20

## 2020-10-21 MED ORDER — DEXAMETHASONE SODIUM PHOSPHATE 10 MG/ML IJ SOLN
INTRAMUSCULAR | Status: DC | PRN
  Administered 2020-10-21: 5 mg via INTRAVENOUS

## 2020-10-21 MED ORDER — CHLORHEXIDINE GLUCONATE 0.12 % MT SOLN
15.0000 mL | Freq: Once | OROMUCOSAL | Status: AC
  Filled 2020-10-21: qty 15

## 2020-10-21 MED ORDER — ONDANSETRON HCL 4 MG/2ML IJ SOLN
INTRAMUSCULAR | Status: AC
  Filled 2020-10-21: qty 6

## 2020-10-21 MED ORDER — ACETAMINOPHEN 10 MG/ML IV SOLN: 1000.0000 mg | Freq: Once | INTRAVENOUS | Status: DC | PRN

## 2020-10-21 MED ORDER — MIDAZOLAM HCL 2 MG/2ML IJ SOLN
INTRAMUSCULAR | Status: AC
  Filled 2020-10-21: qty 2

## 2020-10-21 MED ORDER — 0.9 % SODIUM CHLORIDE (POUR BTL) OPTIME
TOPICAL | Status: DC | PRN
  Administered 2020-10-21: 1000 mL

## 2020-10-21 MED ORDER — KETOROLAC TROMETHAMINE 15 MG/ML IJ SOLN
INTRAMUSCULAR | Status: AC
  Administered 2020-10-21: 15 mg via INTRAVENOUS
  Filled 2020-10-21: qty 1

## 2020-10-21 MED ORDER — OXYCODONE HCL 5 MG PO TABS: 5.0000 mg | ORAL_TABLET | Freq: Once | ORAL | Status: DC | PRN

## 2020-10-21 MED ORDER — PROPOFOL 10 MG/ML IV BOLUS
INTRAVENOUS | Status: DC | PRN
  Administered 2020-10-21: 150 mg via INTRAVENOUS
  Administered 2020-10-21: 50 mg via INTRAVENOUS

## 2020-10-21 MED ORDER — FENTANYL CITRATE (PF) 250 MCG/5ML IJ SOLN
INTRAMUSCULAR | Status: AC
  Filled 2020-10-21: qty 5

## 2020-10-21 MED ORDER — BUPIVACAINE HCL (PF) 0.25 % IJ SOLN
INTRAMUSCULAR | Status: AC
  Filled 2020-10-21: qty 30

## 2020-10-21 MED ORDER — LIDOCAINE 2% (20 MG/ML) 5 ML SYRINGE
INTRAMUSCULAR | Status: AC
  Filled 2020-10-21: qty 15

## 2020-10-21 MED ORDER — LIDOCAINE 2% (20 MG/ML) 5 ML SYRINGE
INTRAMUSCULAR | Status: DC | PRN
  Administered 2020-10-21: 60 mg via INTRAVENOUS

## 2020-10-21 MED ORDER — OXYCODONE HCL 5 MG PO TABS: 5.0000 mg | ORAL_TABLET | Freq: Three times a day (TID) | ORAL | 0 refills | Status: DC | PRN

## 2020-10-21 MED ORDER — FENTANYL CITRATE (PF) 100 MCG/2ML IJ SOLN
25.0000 ug | INTRAMUSCULAR | Status: DC | PRN
  Administered 2020-10-21: 25 ug via INTRAVENOUS

## 2020-10-21 MED ORDER — ACETAMINOPHEN 500 MG PO TABS: 1000.0000 mg | ORAL_TABLET | Freq: Three times a day (TID) | ORAL | 0 refills | Status: DC | PRN

## 2020-10-21 MED ORDER — PHENYLEPHRINE 40 MCG/ML (10ML) SYRINGE FOR IV PUSH (FOR BLOOD PRESSURE SUPPORT)
PREFILLED_SYRINGE | INTRAVENOUS | Status: AC
  Filled 2020-10-21: qty 30

## 2020-10-21 MED ORDER — ROCURONIUM BROMIDE 10 MG/ML (PF) SYRINGE
PREFILLED_SYRINGE | INTRAVENOUS | Status: AC
  Filled 2020-10-21: qty 20

## 2020-10-21 SURGICAL SUPPLY — 37 items
APL PRP STRL LF DISP 70% ISPRP (MISCELLANEOUS)
BNDG GAUZE ELAST 4 BULKY (GAUZE/BANDAGES/DRESSINGS) IMPLANT
CANISTER SUCT 3000ML PPV (MISCELLANEOUS) IMPLANT
CHLORAPREP W/TINT 26 (MISCELLANEOUS) IMPLANT
CLEANER TIP ELECTROSURG 2X2 (MISCELLANEOUS) ×2 IMPLANT
COVER SURGICAL LIGHT HANDLE (MISCELLANEOUS) ×2 IMPLANT
COVER WAND RF STERILE (DRAPES) ×2 IMPLANT
DECANTER SPIKE VIAL GLASS SM (MISCELLANEOUS) IMPLANT
DRAPE HALF SHEET 40X57 (DRAPES) IMPLANT
DRAPE LAPAROSCOPIC ABDOMINAL (DRAPES) ×2 IMPLANT
DRSG PAD ABDOMINAL 8X10 ST (GAUZE/BANDAGES/DRESSINGS) IMPLANT
ELECT REM PT RETURN 9FT ADLT (ELECTROSURGICAL) ×2
ELECTRODE REM PT RTRN 9FT ADLT (ELECTROSURGICAL) ×1 IMPLANT
GAUZE 4X4 16PLY RFD (DISPOSABLE) IMPLANT
GAUZE SPONGE 4X4 12PLY STRL (GAUZE/BANDAGES/DRESSINGS) ×2 IMPLANT
GLOVE BIO SURGEON STRL SZ7 (GLOVE) ×2 IMPLANT
GLOVE BIOGEL PI IND STRL 7.5 (GLOVE) ×1 IMPLANT
GLOVE BIOGEL PI INDICATOR 7.5 (GLOVE) ×1
GOWN STRL REUS W/ TWL LRG LVL3 (GOWN DISPOSABLE) ×2 IMPLANT
GOWN STRL REUS W/TWL LRG LVL3 (GOWN DISPOSABLE) ×4
KIT BASIN OR (CUSTOM PROCEDURE TRAY) ×2 IMPLANT
KIT TURNOVER KIT B (KITS) ×2 IMPLANT
NEEDLE HYPO 25GX1X1/2 BEV (NEEDLE) IMPLANT
NS IRRIG 1000ML POUR BTL (IV SOLUTION) ×2 IMPLANT
PACK GENERAL/GYN (CUSTOM PROCEDURE TRAY) ×2 IMPLANT
PAD ARMBOARD 7.5X6 YLW CONV (MISCELLANEOUS) ×4 IMPLANT
PENCIL SMOKE EVACUATOR (MISCELLANEOUS) ×2 IMPLANT
SPECIMEN JAR SMALL (MISCELLANEOUS) ×2 IMPLANT
SUT MNCRL AB 4-0 PS2 18 (SUTURE) IMPLANT
SUT VIC AB 3-0 SH 27 (SUTURE)
SUT VIC AB 3-0 SH 27XBRD (SUTURE) IMPLANT
SWAB COLLECTION DEVICE MRSA (MISCELLANEOUS) ×2 IMPLANT
SWAB CULTURE ESWAB REG 1ML (MISCELLANEOUS) ×2 IMPLANT
SYR CONTROL 10ML LL (SYRINGE) IMPLANT
TAPE CLOTH 3X10 TAN LF (GAUZE/BANDAGES/DRESSINGS) ×2 IMPLANT
TOWEL GREEN STERILE (TOWEL DISPOSABLE) ×2 IMPLANT
TOWEL GREEN STERILE FF (TOWEL DISPOSABLE) ×2 IMPLANT

## 2020-10-21 NOTE — Discharge Instructions (Signed)
Please remove packing on 10/22/2020 Wet to Dry WOUND CARE: - Change dressing twice daily - Supplies: sterile saline, kerlex, scissors, ABD pads, tape  1. Remove dressing and all packing carefully, moistening with sterile saline as needed to avoid packing/internal dressing sticking to the wound. 2.   Clean edges of skin around the wound with water/gauze, making sure there is no tape debris or leakage left on skin that could cause skin irritation or breakdown. 3.   Dampen and clean kerlex with sterile saline and pack wound from wound base to skin level, making sure to take note of any possible areas of wound tracking, tunneling and packing appropriately. Wound can be packed loosely. Trim kerlex to size if a whole kerlex is not required. 4.   Cover wound with a dry ABD pad and secure with tape.  5.   Write the date/time on the dry dressing/tape to better track when the last dressing change occurred. - apply any skin protectant/powder if recommended by clinician to protect skin/skin folds. - change dressing as needed if leakage occurs, wound gets contaminated, or patient requests to shower. - You may shower daily with wound open and following the shower the wound should be dried and a clean dressing placed.  - Medical grade tape as well as packing supplies can be found at [employer] on [address] or [employer] on [address]. The remaining supplies can be found at your local drug store, [employer] etc.    Incision and Drainage Incision and drainage is a surgical procedure to open and drain a fluid-filled sac. The sac may be filled with pus, mucus, or blood. Examples of fluid-filled sacs that may need surgical drainage include cysts, skin infections (abscesses), and red lumps that develop from a ruptured cyst or a small abscess (boils). You may need this procedure if the affected area is large, painful, infected, or not healing well. Tell a health care provider about:  Any  allergies you have.  All medicines you are taking, including vitamins, herbs, eye drops, creams, and over-the-counter medicines.  Any problems you or family members have had with anesthetic medicines.  Any blood disorders you have or have had.  Any surgeries you have had.  Any medical conditions you have or have had.  Whether you are pregnant or may be pregnant. What are the risks? Generally, this is a safe procedure. However, problems may occur, including:  Infection.  Bleeding.  Allergic reactions to medicines.  Scarring.  The cyst or abscess returns.  Damage to nerves or vessels. What happens before the procedure? Medicine Ask your health care provider about:  Changing or stopping your regular medicines. This is especially important if you are taking diabetes medicines or blood thinners.  Taking medicines such as aspirin and ibuprofen. These medicines can thin your blood. Do not take these medicines unless your health care provider tells you to take them.  Taking over-the-counter medicines, vitamins, herbs, and supplements. Tests You may have an exam or testing. These may include:  Ultrasound or other imaging tests to see how large or deep the fluid-filled sac is.  Blood tests to check for infection. General instructions  Follow instructions from your health care provider about eating or drinking restrictions.  Plan to have someone take you home from the hospital or clinic.  Ask your health care provider whether a responsible adult should care for you for at least 24 hours after you leave the hospital or clinic. This is important.  You may  get a tetanus shot.  Ask your health care provider: ? How your surgery site will be marked or identified. ? What steps will be taken to help prevent infection. These may include:  Removing hair at the surgery site.  Washing skin with a germ-killing soap.  Receiving antibiotic medicine. What happens during the  procedure?  An IV may be inserted into one of your veins.  You will be given one or more of the following: ? A medicine to help you relax (sedative). ? A medicine to numb the area (local anesthetic). ? A medicine to make you fall asleep (general anesthetic).  An incision will be made in the top of the fluid-filled sac.  Pus, blood, and mucus will be squeezed out, and a syringe or tube (drain) may be used to empty more fluid from the sac.  Your health care provider will do one of the following. He or she may: ? Leave the drain in place for several weeks to drain more fluid. ? Stitch open the edges of the incision to make a long-term opening for drainage (marsupialization).  The inside of the sac may be washed out (irrigated) with a sterile solution and packed with gauze before it is covered with a bandage (dressing).  Your health care provider do a culture test of the drainage fluid. The procedure may vary among health care providers and hospitals.   What happens after the procedure?  Your blood pressure, heart rate, breathing rate, and blood oxygen level will be monitored often until you leave the hospital or clinic.  Do not drive for 24 hours if you were given a sedative during your procedure. Summary  Incision and drainage is a surgical procedure to open and drain a fluid-filled sac. The sac may be filled with pus, mucus, or blood.  Before the procedure, you may be given antibiotic medicine to treat or help prevent infection.  During the procedure, an incision will be made in the top of the fluid-filled sac. Pus, blood, and mucus is squeezed out, and a syringe or tube (drain) may be used to empty more fluid from the sac.  The inside of the sac may be washed out (irrigated) with a sterile solution and packed with gauze before it is covered with a bandage (dressing). This information is not intended to replace advice given to you by your health care provider. Make sure you discuss  any questions you have with your health care provider. Document Revised: 08/08/2018 Document Reviewed: 08/08/2018 Elsevier Patient Education  2021 [employer].

## 2020-10-21 NOTE — Anesthesia Procedure Notes (Signed)
Procedure Name: LMA Insertion Date/Time: 10/21/2020 3:35 PM Performed by: Elliot Dally, CRNA Pre-anesthesia Checklist: Patient identified, Emergency Drugs available, Suction available and Patient being monitored Patient Re-evaluated:Patient Re-evaluated prior to induction Oxygen Delivery Method: Circle System Utilized Preoxygenation: Pre-oxygenation with 100% oxygen Induction Type: IV induction Ventilation: Mask ventilation without difficulty LMA: LMA inserted LMA Size: 4.0 Number of attempts: 1 Airway Equipment and Method: Bite block Placement Confirmation: positive ETCO2 Tube secured with: Tape Dental Injury: Teeth and Oropharynx as per pre-operative assessment

## 2020-10-21 NOTE — Op Note (Signed)
Preoperative diagnosis: left gluteal abscess s/p prior I and D Postoperative diagnosis: saa Procedure: debridement and drainage of 4x4 cm gluteal abscess Surgeon: Dr Harden Mo Anesthesia: general EBL minimal Specimens: cultures to microbiology Complications none Drains none Sponge and needle count correct dispo recovery stable  Indications:  40 year old male who presented on 10/16/2020 for left buttocks wound abscess.  He reported that it occurred approximately 2 weeks ago.  Developed an area of swelling and redness with some occasional drainage.  He underwent I&D the abscess with overlying skin necrosis and purulent drainage.   He presented today with drainage and a necrotic area on his left buttocks. I discussed going to OR for this as it appeared to be not adequately drained.  Procedure: After informed consent obtained patient was taken to the OR.  He was on antibiotics.  He had SCDs in placed. He was placed under general anesthesia without complication.  He was rolled into right lateral decubitus position and appropriately padded.  He was prepped and draped in standard sterile surgical fashion. Surgical timeout was performed.   I removed the exudate overlying the wound.  There was purulence present. I cultured this. I then removed a 4x4 cm area of necrotic tissue.  I irrigated this and placed a wet to dry dressing.  He tolerated well and was transferred to pacu stable.

## 2020-10-21 NOTE — H&P (Addendum)
[facility] H&P  Jonathon Stone June 09, 1981  [id number].    Requesting MD: Trudee Grip Chief Complaint; buttocks wound   HPI:  Patient is a 40 year old male who presented on 10/16/2020 for left buttocks wound abscess.  He reported that it occurred approximately 2 weeks ago.  He noted while lying on the floor and felt a sharp pinch.  Developed an area of swelling and redness with some occasional drainage.  Seen in urgent care and placed on doxycycline, and warm compresses.  He underwent I&D the abscess with overlying skin necrosis and purulent drainage.  He was supposed to follow-up in our office last week but did not.  He presented today with drainage and a necrotic area on his left buttocks.  ROS: Review of Systems  All other systems reviewed and are negative.   Family History  Problem Relation Age of Onset  . Healthy Mother   . Heart attack Father   . Cancer Maternal Grandmother   . Cancer Maternal Aunt   . Cancer Paternal Grandmother     No past medical history on file.  No past surgical history on file.  Social History:  reports that he has been smoking cigarettes. He has been smoking about 1.00 pack per day. He has never used smokeless tobacco. He reports current alcohol use. He reports that he does not use drugs.  Allergies: No Known Allergies  Prior to Admission medications   Medication Sig Start Date End Date Taking? Authorizing Provider  doxycycline (VIBRAMYCIN) 100 MG capsule Take 1 capsule (100 mg total) by mouth 2 (two) times daily for 10 days. 10/12/20 10/22/20  Wieters, Hallie C, PA-C  HYDROcodone-acetaminophen (NORCO/VICODIN) 5-325 MG tablet Take 1 tablet by mouth every 6 (six) hours as needed for severe pain. 10/16/20   Caccavale, Sophia, PA-C  ibuprofen (ADVIL) 800 MG tablet Take 1 tablet (800 mg total) by mouth 3 (three) times daily. 10/12/20   Wieters, Hallie C, PA-C  sulfamethoxazole-trimethoprim (BACTRIM DS) 800-160 MG tablet Take 1 tablet by mouth 2  (two) times daily for 7 days. 10/16/20 10/23/20  Caccavale, Sophia, PA-C     Blood pressure (!) 144/93, pulse 72, temperature 98.1 F (36.7 C), temperature source Oral, resp. rate 14, height 5\' 9"  (1.753 m), weight 56.7 kg, SpO2 100 %. Physical Exam:  General: Pleasant thin African-American male who is laying in bed who is very uncomfortable secondary to the wound on his left buttocks. HEENT: head is normocephalic, atraumatic.  Sclera are noninjected.  Pupils are equal .  Ears and nose without any masses or lesions.  Mouth is pink and moist Heart: regular, rate, and rhythm.  Normal s1,s2. No obvious murmurs, gallops, or rubs noted.  Palpable radial and pedal pulses bilaterally Lungs: CTAB, no wheezes, rhonchi, or rales noted.  Respiratory effort nonlabored Abd: soft, NT, ND, +BS, no masses, hernias, or organomegaly MS: all 4 extremities are symmetrical with no cyanosis, clubbing, or edema. Skin: See the picture below Neuro: Cranial nerves 2-12 grossly intact, sensation is normal throughout Psych: A&Ox3 with an appropriate affect.    Patient has whole layer of skin with a white fibrotic-looking, which looks like full-thickness skin necrosis.  You can run a Q-tip underneath the whole thing is about 3 cm in diameter with some purulent drainage.  The area around it is hard and indurated.   No results found for this or any previous visit (from the past 48 hour(s)). No results found.    Assessment/Plan  Left gluteal abscess.  Plan:  We will plan to take him to the OR later this afternoon after his Covid is back for debridement of the wound.  Place him on IV antibiotics and IV fluids prior to surgery.  Hopefully he will be able to go home after surgery later this evening.  Covid study, CBC, BMP are pending.  Sherrie George [facility] 10/21/2020, 11:12 AM Please see Amion for pager number during day hours 7:00am-4:30pm

## 2020-10-21 NOTE — Anesthesia Postprocedure Evaluation (Signed)
Anesthesia Post Note  Patient: Jonathon Stone  Procedure(s) Performed: IRRIGATION AND DEBRIDEMENT LEFT BUTTOCKS ABSCESS (Left Buttocks)     Patient location during evaluation: PACU Anesthesia Type: General Level of consciousness: awake and alert Pain management: pain level controlled Vital Signs Assessment: post-procedure vital signs reviewed and stable Respiratory status: spontaneous breathing, nonlabored ventilation, respiratory function stable and patient connected to nasal cannula oxygen Cardiovascular status: blood pressure returned to baseline and stable Postop Assessment: no apparent nausea or vomiting Anesthetic complications: no   No complications documented.  Last Vitals:  Vitals:   10/21/20 1635 10/21/20 1650  BP: 104/78 115/75  Pulse: (!) 55 (!) 59  Resp: 14 14  Temp:    SpO2: 99% 100%    Last Pain:  Vitals:   10/21/20 1650  TempSrc:   PainSc: 4                  Freddi Schrager P Nazier Neyhart

## 2020-10-21 NOTE — ED Provider Notes (Signed)
I provided a substantive portion of the care of this patient.  I personally performed the entirety of the exam for this encounter.    Patient has had almost 3 weeks of increasingly swollen and draining wound on the left buttock.  He reports pain has improved relative to at onset.  The wound continues to be large with surrounding swelling and drainage.  Patient denies he is having fevers or chills.  Alert and nontoxic.  Wound examined.  There is a large ulcerated wound with exudative eschar.  Continued surrounding induration.  Total wound diameter about 7 cm.  Atypical, persistent soft tissue wound in reportedly healthy 40 year old.  Will consult general surgery for evaluation to determine if wound debridement and additional diagnostic testing appropriate.   Arby Barrette, MD 10/21/20 [phone]

## 2020-10-21 NOTE — ED Provider Notes (Signed)
[facility] EMERGENCY DEPARTMENT Provider Note   CSN: [id number] Arrival date & time: 10/21/20  0754     History Chief Complaint  Patient presents with  . Wound Check    Jonathon Stone is a 40 y.o. male.  HPI 40 year old male with Stone significant medical history presents to the ER for wound recheck.  He reports approximate 3 weeks ago, he was laying on the floor and felt a sharp pinch to the left side of his buttocks.  Since then had developed an abscess to the left buttock.  Seen at urgent care initially on 1/22, started on doxycycline.  He then presented to the ED on 1/26 with complaints of worsening infection.  [facility] surgery was consulted, they recommended CT scan which showed cellulitis with a underlying abscess.  General surgery drained and recommended keeping on the doxycycline.  He returns 4 days later for a wound recheck.  He feels as though his wound has improved, however still reports pain to the area.  Stone known fevers or chills.  He has been cleaning it daily, changing bandages, take antibiotics as prescribed.    Stone past medical history on file.  There are Stone problems to display for this patient.   Stone past surgical history on file.     Family History  Problem Relation Age of Onset  . Healthy Mother   . Heart attack Father   . Cancer Maternal Grandmother   . Cancer Maternal Aunt   . Cancer Paternal Grandmother     Social History   Tobacco Use  . Smoking status: Current Every Day Smoker    Packs/day: 1.00    Types: Cigarettes  . Smokeless tobacco: Never Used  Vaping Use  . Vaping Use: Never used  Substance Use Topics  . Alcohol use: Yes  . Drug use: Stone    Home Medications Prior to Admission medications   Medication Sig Start Date End Date Taking? Authorizing Provider  doxycycline (VIBRAMYCIN) 100 MG capsule Take 1 capsule (100 mg total) by mouth 2 (two) times daily for 10 days. 10/12/20 10/22/20  Wieters, Hallie C, PA-C   HYDROcodone-acetaminophen (NORCO/VICODIN) 5-325 MG tablet Take 1 tablet by mouth every 6 (six) hours as needed for severe pain. 10/16/20   Caccavale, Sophia, PA-C  ibuprofen (ADVIL) 800 MG tablet Take 1 tablet (800 mg total) by mouth 3 (three) times daily. 10/12/20   Wieters, Hallie C, PA-C  sulfamethoxazole-trimethoprim (BACTRIM DS) 800-160 MG tablet Take 1 tablet by mouth 2 (two) times daily for 7 days. 10/16/20 10/23/20  Caccavale, Sophia, PA-C    Allergies    Patient has Stone known allergies.  Review of Systems   Review of Systems  Constitutional: Negative for chills and fever.  Skin: Positive for color change and wound.    Physical Exam Updated Vital Signs BP (!) 144/93   Pulse 72   Temp 98.1 F (36.7 C) (Oral)   Resp 14   Ht 5\' 9"  (1.753 m)   Wt 56.7 kg   SpO2 100%   BMI 18.46 kg/m   Physical Exam Vitals and nursing note reviewed.  Constitutional:      General: He is not in acute distress.    Appearance: He is well-developed and well-nourished. He is not ill-appearing, toxic-appearing or diaphoretic.  HENT:     Head: Normocephalic and atraumatic.  Eyes:     Conjunctiva/sclera: Conjunctivae normal.  Cardiovascular:     Rate and Rhythm: Normal rate and regular rhythm.  Pulses: Normal pulses.     Heart sounds: Normal heart sounds. Stone murmur heard.   Pulmonary:     Effort: Pulmonary effort is normal. Stone respiratory distress.     Breath sounds: Normal breath sounds.  Abdominal:     General: Abdomen is flat.     Palpations: Abdomen is soft.     Tenderness: There is Stone abdominal tenderness.  Musculoskeletal:        General: Stone edema. Normal range of motion.     Cervical back: Neck supple.     Right lower leg: Stone edema.     Left lower leg: Stone edema.  Skin:    General: Skin is warm and dry.     Comments: Large ulcerated wound to the left lateral side of the buttocks, approximately 6 to 7 cm in diameter.  Central eschar noted, large area of induration surrounding  induration.  Mildly warm to touch.  Very mild visible drainage.  Neurological:     General: Stone focal deficit present.     Mental Status: He is alert and oriented to person, place, and time.     Sensory: Stone sensory deficit.     Motor: Stone weakness.  Psychiatric:        Mood and Affect: Mood and affect normal.       ED Results / Procedures / Treatments   Labs (all labs ordered are listed, but only abnormal results are displayed) Labs Reviewed  SARS CORONAVIRUS 2 BY RT PCR (HOSPITAL ORDER, PERFORMED IN [facility])    EKG None  Radiology Stone results found.  Procedures Procedures   Medications Ordered in ED Medications - Stone data to display  ED Course  I have reviewed the triage vital signs and the nursing notes.  Pertinent labs & imaging results that were available during my care of the patient were reviewed by me and considered in my medical decision making (see chart for details).    MDM Rules/Calculators/A&P                          40 year old male presents to the ER for wound recheck.  Notable abscess on the left side of the buttock, which was brought drained 4 days ago.  Patient has been compliant with medications, feels as though it has been improving.  Still painful but less painful than at initial onset.  Wound appears to have developed central eschar, with some mild drainage noted.  Will consult general surgery to have them evaluate the wound to see if he needs any additional debridement.  Spoke with Will Marlyne Beards, PA-C who states that the patient will be taken to the OR for debridement.  Covid test ordered per their request.   Final Clinical Impression(s) / ED Diagnoses Final diagnoses:  Encounter for recheck of abscess following incision and drainage    Rx / DC Orders ED Discharge Orders    None       Leone Brand 10/21/20 1104    Arby Barrette, MD 10/21/20 1212

## 2020-10-21 NOTE — ED Triage Notes (Signed)
Pt had abscess on buttock drained on 1/26. Here for follow up/wound check.

## 2020-10-21 NOTE — Anesthesia Preprocedure Evaluation (Addendum)
Anesthesia Evaluation  Patient identified by MRN, date of birth, ID band Patient awake    Reviewed: Allergy & Precautions, NPO status , Patient's Chart, lab work & pertinent test results  Airway Mallampati: II  TM Distance: >3 FB Neck ROM: Full    Dental  (+) Teeth Intact   Pulmonary neg pulmonary ROS, Current Smoker,    Pulmonary exam normal        Cardiovascular negative cardio ROS   Rhythm:Regular Rate:Normal     Neuro/Psych negative neurological ROS  negative psych ROS   GI/Hepatic negative GI ROS, Neg liver ROS,   Endo/Other  negative endocrine ROS  Renal/GU negative Renal ROS  negative genitourinary   Musculoskeletal Left buttock abscess   Abdominal Normal abdominal exam  (+)   Peds  Hematology negative hematology ROS (+)   Anesthesia Other Findings   Reproductive/Obstetrics                            Anesthesia Physical Anesthesia Plan  ASA: II  Anesthesia Plan: General   Post-op Pain Management:    Induction: Intravenous  PONV Risk Score and Plan: 1 and Ondansetron, Midazolam and Treatment may vary due to age or medical condition  Airway Management Planned: Mask and Oral ETT  Additional Equipment: None  Intra-op Plan:   Post-operative Plan: Extubation in OR  Informed Consent: I have reviewed the patients History and Physical, chart, labs and discussed the procedure including the risks, benefits and alternatives for the proposed anesthesia with the patient or authorized representative who has indicated his/her understanding and acceptance.     Dental advisory given  Plan Discussed with: CRNA  Anesthesia Plan Comments: (Lab Results      Component                Value               Date                      WBC                      10.5                10/21/2020                HGB                      12.7 (L)            10/21/2020                HCT                       40.4                10/21/2020                MCV                      87.1                10/21/2020                PLT                      551 (H)  10/21/2020           Lab Results      Component                Value               Date                      NA                       137                 10/21/2020                K                        4.0                 10/21/2020                CO2                      22                  10/21/2020                GLUCOSE                  74                  10/21/2020                BUN                      9                   10/21/2020                CREATININE               0.88                10/21/2020                CALCIUM                  9.0                 10/21/2020                GFRNONAA                 >60                 10/21/2020          )        Anesthesia Quick Evaluation

## 2020-10-21 NOTE — Transfer of Care (Addendum)
Immediate Anesthesia Transfer of Care Note  Patient: Jonathon Stone  Procedure(s) Performed: IRRIGATION AND DEBRIDEMENT ABSCESS (Left )  Patient Location: PACU  Anesthesia Type:General  Level of Consciousness: drowsy  Airway & Oxygen Therapy: Patient Spontanous Breathing and Patient connected to nasal cannula oxygen  Post-op Assessment: Report given to RN and Post -op Vital signs reviewed and stable  Post vital signs: Reviewed and stable  Last Vitals:  Vitals Value Taken Time  BP 94/55 10/21/20 1603  Temp    Pulse 51 10/21/20 1613  Resp 14 10/21/20 1613  SpO2 98 % 10/21/20 1613  Vitals shown include unvalidated device data.  Last Pain:  Vitals:   10/21/20 1355  TempSrc: Oral  PainSc:          Complications: No complications documented.

## 2020-10-22 ENCOUNTER — Other Ambulatory Visit: Payer: Self-pay

## 2020-10-22 ENCOUNTER — Emergency Department (HOSPITAL_COMMUNITY)
Admission: EM | Admit: 2020-10-22 | Discharge: 2020-10-22 | Disposition: A | Discharge status: Home / Self Care | Payer: 59 | Attending: Emergency Medicine | Admitting: Emergency Medicine

## 2020-10-22 ENCOUNTER — Encounter (HOSPITAL_COMMUNITY): Payer: Self-pay | Admitting: General Surgery

## 2020-10-22 DIAGNOSIS — Z5321 Procedure and treatment not carried out due to patient leaving prior to being seen by health care provider: Secondary | ICD-10-CM | POA: Diagnosis not present

## 2020-10-22 DIAGNOSIS — R1110 Vomiting, unspecified: Secondary | ICD-10-CM | POA: Diagnosis present

## 2020-10-22 DIAGNOSIS — R1012 Left upper quadrant pain: Secondary | ICD-10-CM | POA: Diagnosis not present

## 2020-10-22 LAB — CBC
HCT: 38.3 % — ABNORMAL LOW (ref 39.0–52.0)
Hemoglobin: 12.4 g/dL — ABNORMAL LOW (ref 13.0–17.0)
MCH: 27.2 pg (ref 26.0–34.0)
MCHC: 32.4 g/dL (ref 30.0–36.0)
MCV: 84 fL (ref 80.0–100.0)
Platelets: 601 10*3/uL — ABNORMAL HIGH (ref 150–400)
RBC: 4.56 MIL/uL (ref 4.22–5.81)
RDW: 14.4 % (ref 11.5–15.5)
WBC: 13.9 10*3/uL — ABNORMAL HIGH (ref 4.0–10.5)
nRBC: 0 % (ref 0.0–0.2)

## 2020-10-22 LAB — COMPREHENSIVE METABOLIC PANEL
ALT: 13 U/L (ref 0–44)
AST: 18 U/L (ref 15–41)
Albumin: 4.2 g/dL (ref 3.5–5.0)
Alkaline Phosphatase: 59 U/L (ref 38–126)
Anion gap: 14 (ref 5–15)
BUN: 18 mg/dL (ref 6–20)
CO2: 25 mmol/L (ref 22–32)
Calcium: 10.3 mg/dL (ref 8.9–10.3)
Chloride: 98 mmol/L (ref 98–111)
Creatinine, Ser: 1.08 mg/dL (ref 0.61–1.24)
GFR, Estimated: >60 mL/min (ref >60)
Glucose, Bld: 128 mg/dL — ABNORMAL HIGH (ref 70–99)
Potassium: 3.8 mmol/L (ref 3.5–5.1)
Sodium: 137 mmol/L (ref 135–145)
Total Bilirubin: 0.5 mg/dL (ref 0.3–1.2)
Total Protein: 7.5 g/dL (ref 6.5–8.1)

## 2020-10-22 LAB — LIPASE, BLOOD: Lipase: 28 U/L (ref 11–51)

## 2020-10-22 NOTE — ED Triage Notes (Signed)
Pt arrives to ED with c/o coffee-ground emesis that started this morning. Pt has had x5 episodes as well as LUQ abdominal pain. This started when pt awoke this morning. Pt had surgery on left butocks abscess  Yesterday. Pt reports starting antibiotics and pain medicine after surgery.

## 2020-10-23 LAB — SURGICAL PATHOLOGY

## 2020-10-25 ENCOUNTER — Encounter (HOSPITAL_COMMUNITY): Payer: Self-pay | Admitting: Emergency Medicine

## 2020-10-25 ENCOUNTER — Other Ambulatory Visit: Payer: Self-pay

## 2020-10-25 ENCOUNTER — Ambulatory Visit (HOSPITAL_COMMUNITY)
Admission: EM | Admit: 2020-10-25 | Discharge: 2020-10-25 | Disposition: A | Discharge status: Home / Self Care | Payer: 59 | Attending: Medical Oncology | Admitting: Medical Oncology

## 2020-10-25 DIAGNOSIS — R112 Nausea with vomiting, unspecified: Secondary | ICD-10-CM

## 2020-10-25 DIAGNOSIS — L0291 Cutaneous abscess, unspecified: Secondary | ICD-10-CM

## 2020-10-25 MED ORDER — ONDANSETRON 4 MG PO TBDP
ORAL_TABLET | ORAL | Status: AC
  Filled 2020-10-25: qty 1

## 2020-10-25 MED ORDER — ONDANSETRON 4 MG PO TBDP
4.0000 mg | ORAL_TABLET | Freq: Once | ORAL | Status: AC
  Administered 2020-10-25: 4 mg via ORAL

## 2020-10-25 MED ORDER — ONDANSETRON 4 MG PO TBDP: 4.0000 mg | ORAL_TABLET | Freq: Three times a day (TID) | ORAL | 0 refills | Status: DC | PRN

## 2020-10-25 NOTE — ED Provider Notes (Signed)
[facility]    CSN: [id number] Arrival date & time: 10/25/20  1039      History   Chief Complaint Chief Complaint  Patient presents with  . Abscess  . Nausea    HPI Jonathon Stone is a 40 y.o. male.   HPI   Abscess: Pt was seen in the ER on 10/12/2020, 10/16/2020, 10/21/2020 for abscess of the left buttock. He underwent surgical debridement on 10/21/2020 and was subsequently given oxycodone and augmentin. He reports that he has been nauseated and having hallucinations from the oxycodone so he has not been able to take his augmentin as prescribed due to worsening nausea when he takes this medication. Is able to hold down liquids but food only stays in stomach for a few hours and returns. He denies seeing bloody vomiting but may have had 1 episode of brown vomiting. No recent fevers. He has wound care follow up scheduled on Monday.   History reviewed. No pertinent past medical history.  There are no problems to display for this patient.   Past Surgical History:  Procedure Laterality Date  . IRRIGATION AND DEBRIDEMENT ABSCESS Left 10/21/2020   Procedure: IRRIGATION AND DEBRIDEMENT LEFT BUTTOCKS ABSCESS;  Surgeon: Emelia Loron, MD;  Location: [facility] OR;  Service: General;  Laterality: Left;     Home Medications    Prior to Admission medications   Medication Sig Start Date End Date Taking? Authorizing Provider  acetaminophen (TYLENOL) 500 MG tablet Take 2 tablets (1,000 mg total) by mouth every 8 (eight) hours as needed. 10/21/20   Maczis, Elmer Sow, PA-C  amoxicillin-clavulanate (AUGMENTIN) 875-125 MG tablet Take 1 tablet by mouth every 12 (twelve) hours. 10/21/20   Maczis, Elmer Sow, PA-C  oxyCODONE (ROXICODONE) 5 MG immediate release tablet Take 1 tablet (5 mg total) by mouth every 8 (eight) hours as needed for severe pain. 10/21/20   Maczis, Elmer Sow, PA-C    Family History Family History  Problem Relation Age of Onset  . Healthy Mother   . Heart attack  Father   . Cancer Maternal Grandmother   . Cancer Maternal Aunt   . Cancer Paternal Grandmother     Social History Social History   Tobacco Use  . Smoking status: Current Every Day Smoker    Packs/day: 1.00    Types: Cigarettes  . Smokeless tobacco: Never Used  Vaping Use  . Vaping Use: Never used  Substance Use Topics  . Alcohol use: Yes  . Drug use: No     Allergies   Patient has no known allergies.   Review of Systems Review of Systems  As stated above in HPI  Physical Exam Triage Vital Signs ED Triage Vitals  Enc Vitals Group     BP 10/25/20 1149 124/89     Pulse Rate 10/25/20 1149 66     Resp 10/25/20 1149 19     Temp 10/25/20 1149 98.3 F (36.8 C)     Temp Source 10/25/20 1149 Oral     SpO2 10/25/20 1149 98 %     Weight --      Height --      Head Circumference --      Peak Flow --      Pain Score 10/25/20 1153 8     Pain Loc --      Pain Edu? --      Excl. in GC? --    No data found.  Updated Vital Signs BP 124/89 (BP Location: Left Arm)  Pulse 66   Temp 98.3 F (36.8 C) (Oral)   Resp 19   SpO2 98%   Physical Exam Vitals and nursing note reviewed.  Constitutional:      General: He is not in acute distress.    Appearance: He is not ill-appearing, toxic-appearing or diaphoretic.  HENT:     Mouth/Throat:     Mouth: Mucous membranes are dry.  Cardiovascular:     Rate and Rhythm: Normal rate and regular rhythm.     Heart sounds: Normal heart sounds.  Pulmonary:     Effort: Pulmonary effort is normal.     Breath sounds: Normal breath sounds.  Lymphadenopathy:     Cervical: No cervical adenopathy.  Skin:    Comments: Surgical wound of left buttock does not have any erythema, tunneling, or discharge. No surrounding fluctuance or induration.   Neurological:     Mental Status: He is alert.      UC Treatments / Results  Labs (all labs ordered are listed, but only abnormal results are displayed) Labs Reviewed - No data to  display  EKG   Radiology No results found.  Procedures Procedures (including critical care time)  Medications Ordered in UC Medications  ondansetron (ZOFRAN-ODT) disintegrating tablet 4 mg (4 mg Oral Given 10/25/20 1151)    Initial Impression / Assessment and Plan / UC Course  I have reviewed the triage vital signs and the nursing notes.  Pertinent labs & imaging results that were available during my care of the patient were reviewed by me and considered in my medical decision making (see chart for details).     New.  Patient given Zofran in office and greatly improves.  Treating with Zofran.  Discussed with patient.  He will no longer take his oxycodone I will add this to his allergy list.  He can take tylenol PRN and as directed on the bottle. He suspects that he will be able to tolerate the Augmentin once his nausea is under control from the oxycodone.  We did discuss I would recommend eating small bites of food about 20 minutes apart prior to taking the Augmentin to avoid further GI upset.  He needs to stay hydrated with water.  We had a long discussion about red flag signs and symptoms that would warrant return to the emergency room.  Based on exam and vitals he is does not appear septic however we discussed red flag symptoms as well. Has driver today. He will keep his follow-up with wound care on Monday. Final Clinical Impressions(s) / UC Diagnoses   Final diagnoses:  None   Discharge Instructions   None    ED Prescriptions    None     PDMP not reviewed this encounter.   Rushie Chestnut, [place] 10/25/20 1246

## 2020-10-25 NOTE — ED Triage Notes (Signed)
Pt presents with abscess on left hip/ buttocks area after spider bite. C/o of nausea, vomiting and fever that comes and goes.   States was prescribed oxycodone and amoxicillin by ER. States started having hallucinations and stopped taking medication.

## 2020-10-25 NOTE — Discharge Instructions (Signed)
Stop taking the oxycodone. Take tylenol as needed and as directed on the bottle.

## 2020-10-26 LAB — AEROBIC/ANAEROBIC CULTURE W GRAM STAIN (SURGICAL/DEEP WOUND): Gram Stain: NONE SEEN

## 2020-11-25 ENCOUNTER — Other Ambulatory Visit: Payer: Self-pay

## 2020-11-25 ENCOUNTER — Encounter: Payer: Self-pay | Admitting: Medical

## 2020-11-25 ENCOUNTER — Ambulatory Visit (INDEPENDENT_AMBULATORY_CARE_PROVIDER_SITE_OTHER): Payer: 59 | Admitting: Medical

## 2020-11-25 VITALS — BP 114/78 | HR 106 | Ht 70.0 in | Wt 124.6 lb

## 2020-11-25 DIAGNOSIS — F17200 Nicotine dependence, unspecified, uncomplicated: Secondary | ICD-10-CM | POA: Insufficient documentation

## 2020-11-25 DIAGNOSIS — Z716 Tobacco abuse counseling: Secondary | ICD-10-CM | POA: Diagnosis not present

## 2020-11-25 DIAGNOSIS — D72829 Elevated white blood cell count, unspecified: Secondary | ICD-10-CM | POA: Insufficient documentation

## 2020-11-25 DIAGNOSIS — L0231 Cutaneous abscess of buttock: Secondary | ICD-10-CM | POA: Insufficient documentation

## 2020-11-25 DIAGNOSIS — D649 Anemia, unspecified: Secondary | ICD-10-CM | POA: Insufficient documentation

## 2020-11-25 DIAGNOSIS — R7309 Other abnormal glucose: Secondary | ICD-10-CM | POA: Insufficient documentation

## 2020-11-25 DIAGNOSIS — Z8249 Family history of ischemic heart disease and other diseases of the circulatory system: Secondary | ICD-10-CM | POA: Insufficient documentation

## 2020-11-25 MED ORDER — BUPROPION HCL ER (XL) 150 MG PO TB24: 150.0000 mg | ORAL_TABLET | Freq: Every day | ORAL | 1 refills | Status: DC

## 2020-11-25 NOTE — Progress Notes (Signed)
Subjective:  Jonathon Stone is a 40 y.o. male who presents for Chief Complaint  Patient presents with  . New Patient (Initial Visit)     Here as a new patient.   Has been in good health in general, hasn't had routine PCP.  Had a recent health scare with brown recluse spider bite on left buttock.   Initially saw urgent care for abscess of buttock, but ultimately had to have 2 surgery due to the illness.  Almost back to normal but buttock still healing.  Has follow appt with surgeon March 10th with [facility].  Wanted to establish care in general.    Hx/o tobacco use -in the past he was going to try Chantix.  The side effects scare him.  Thus he has never been on medicine other than nicotine replacement which did not help  No other aggravating or relieving factors.    No other c/o.  Past Medical History:  Diagnosis Date  . Abscess 09/2020   left buttock     The following portions of the patient's history were reviewed and updated as appropriate: allergies, current medications, past family history, past medical history, past social history, past surgical history and problem list.  ROS Otherwise as in subjective above     Objective: BP 114/78   Pulse (!) 106   Ht 5\' 10"  (1.778 m)   Wt 124 lb 9.6 oz (56.5 kg)   SpO2 98%   BMI 17.88 kg/m   General appearance: alert, no distress, well developed, well nourished Neck: supple, no lymphadenopathy, no thyromegaly, no masses Heart: RRR, normal S1, S2, no murmurs Lungs: CTA bilaterally, no wheezes, rhonchi, or rales Pulses: 2+ radial pulses, 2+ pedal pulses, normal cap refill Ext: no edema   Assessment: Encounter Diagnoses  Name Primary?  . Cutaneous abscess of buttock Yes  . Smoker   . Encounter for tobacco use cessation counseling   . Leukocytosis, unspecified type   . Elevated glucose   . Family history of premature CAD   . Anemia, unspecified type      Plan: I reviewed his recent hospital  reports, labs, CT imaging and pelvis  Buttock abscess, recent brown recluse spider bite-he has follow-up with [facility] this week  Smoker-he agrees to attempt to quit smoking.  We discussed the need for counseling plus medication plus desire to quit.  Begin trial of Wellbutrin.  Discussed risk and benefits of medicine, proper use of medication.  Leukocytosis on recent labs likely due to abscess.  Plan to recheck this in a month  Anemia on recent labs-possibly dilution related to IV fluids.  Recheck in a month  Elevated glucose-plan for diabetes screening in the upcoming physical  Family history of premature CAD-discussed the significance of this and his smoking use.  He agrees to attempt to quit smoking.  Follow-up in 1 month for fasting visit for physical  Jonathon Stone was seen today for new patient (initial visit).  Diagnoses and all orders for this visit:  Cutaneous abscess of buttock  Smoker  Encounter for tobacco use cessation counseling  Leukocytosis, unspecified type  Elevated glucose  Family history of premature CAD  Anemia, unspecified type  Other orders -     buPROPion (WELLBUTRIN XL) 150 MG 24 hr tablet; Take 1 tablet (150 mg total) by mouth daily.    Follow up: 27mo for fasting physical

## 2020-11-25 NOTE — Patient Instructions (Signed)
Anemia - on recent labs.  Lets plan to recheck this in 4-6 week on a physical visit.    It is not clear if you have been anemic in the past or not.  It is not normal for a male of your age to be anemic unless there is blood loss or underlying hereditary issue.  Check with family or extended family for any prior blood disorders such as thalassemia or other types of anemia  Elevated glucose on recent lab- this could have been non fasting.   Lets recheck a "fasting" glucose when you return for a physical  Father history of heart disease in 48s puts you at risk for heart disease. We will plan a baseline EKG on you physical visit  Tobacco use - I strongly recommend you quit tobacco.  Consider medication and counseling to help quit.  [place] 1-800-QUIT NOW hotline for some guidance or tips on quitting.  Let me know if you want to start medicaiton to help quit  Abscess - follow up with surgeon as planned  Vaccine recommendations:  I recommend a pneumococcal 23 vaccine to reduce your risk of bacterial pneumonia since you are a smoker  I recommend an updated Tdap, tetanus diptheria pertussis booster given the recent abscess.

## 2020-12-26 ENCOUNTER — Other Ambulatory Visit: Payer: Self-pay

## 2020-12-26 ENCOUNTER — Encounter (HOSPITAL_COMMUNITY): Payer: Self-pay | Admitting: Emergency Medicine

## 2020-12-26 ENCOUNTER — Ambulatory Visit (HOSPITAL_COMMUNITY)
Admission: EM | Admit: 2020-12-26 | Discharge: 2020-12-26 | Disposition: A | Discharge status: Home / Self Care | Payer: 59 | Attending: Internal Medicine | Admitting: Internal Medicine

## 2020-12-26 DIAGNOSIS — L700 Acne vulgaris: Secondary | ICD-10-CM | POA: Diagnosis not present

## 2020-12-26 MED ORDER — DOXYCYCLINE HYCLATE 100 MG PO CAPS: 100.0000 mg | ORAL_CAPSULE | Freq: Two times a day (BID) | ORAL | 0 refills | Status: AC

## 2020-12-26 MED ORDER — CLINDAMYCIN PHOSPHATE 1 % EX GEL: Freq: Two times a day (BID) | CUTANEOUS | 0 refills | Status: DC

## 2020-12-26 NOTE — Discharge Instructions (Signed)
Medications as tolerated If you have worsening swelling, redness, purulent discharge or worsening pain-please return to the urgent care to be reevaluated

## 2020-12-26 NOTE — ED Triage Notes (Signed)
Patient reports a bump on bridge of nose and visible swelling .  Patient has had a history of the same.

## 2020-12-26 NOTE — ED Provider Notes (Signed)
[facility]    CSN: [id number] Arrival date & time: 12/26/20  0931      History   Chief Complaint Chief Complaint  Patient presents with  . Abscess    HPI Jonathon Stone is a 40 y.o. male with a history of cystic acne vulgaris comes to the urgent care with painful swelling on the bridge of the nose which started yesterday.  Patient says that the onset was fairly sudden and has been persistent.  He has tried using warm compress with no improvement in symptoms.  This morning he noticed swelling spreading over his forehead and also in the infraorbital area bilaterally.  He has also noticed some redness around the lesion and appears to be spreading over the forehead and the face as well.  No fever or chills.  No nausea or vomiting.  No known relieving factors.  Pain is aggravated by touching the lesion  HPI  Past Medical History:  Diagnosis Date  . Abscess 09/2020   left buttock    Patient Active Problem List   Diagnosis Date Noted  . Cutaneous abscess of buttock 11/25/2020  . Smoker 11/25/2020  . Encounter for tobacco use cessation counseling 11/25/2020  . Leukocytosis 11/25/2020  . Elevated glucose 11/25/2020  . Family history of premature CAD 11/25/2020  . Anemia 11/25/2020    Past Surgical History:  Procedure Laterality Date  . IRRIGATION AND DEBRIDEMENT ABSCESS Left 10/21/2020   Procedure: IRRIGATION AND DEBRIDEMENT LEFT BUTTOCKS ABSCESS;  Surgeon: Emelia Loron, MD;  Location: [facility] OR;  Service: General;  Laterality: Left;       Home Medications    Prior to Admission medications   Medication Sig Start Date End Date Taking? Authorizing Provider  clindamycin (CLINDAGEL) 1 % gel Apply topically 2 (two) times daily. 12/26/20  Yes Avangeline Stockburger, Britta Mccreedy, MD  doxycycline (VIBRAMYCIN) 100 MG capsule Take 1 capsule (100 mg total) by mouth 2 (two) times daily for 7 days. 12/26/20 01/02/21 Yes Kailea Dannemiller, Britta Mccreedy, MD  acetaminophen (TYLENOL) 500 MG tablet Take 2  tablets (1,000 mg total) by mouth every 8 (eight) hours as needed. 10/21/20   Maczis, Elmer Sow, PA-C  buPROPion (WELLBUTRIN XL) 150 MG 24 hr tablet Take 1 tablet (150 mg total) by mouth daily. Patient taking differently: Take 150 mg by mouth daily. Has not picked up script, yet 11/25/20   Tysinger, Kermit Balo, PA-C    Family History Family History  Problem Relation Age of Onset  . Neuropathy Mother   . Heart attack Father 52       2 MI  . Heart disease Father   . Cancer Maternal Grandmother        lung  . Cancer Paternal Grandmother        bone  . Diabetes Neg Hx   . Hypertension Neg Hx     Social History Social History   Tobacco Use  . Smoking status: Current Every Day Smoker    Packs/day: 0.50    Years: 23.00    Pack years: 11.50    Types: Cigarettes  . Smokeless tobacco: Never Used  Vaping Use  . Vaping Use: Never used  Substance Use Topics  . Alcohol use: Not Currently  . Drug use: Yes    Types: Marijuana     Allergies   Oxycodone   Review of Systems Review of Systems  Respiratory: Negative.   Gastrointestinal: Negative.   Musculoskeletal: Negative.   Skin: Positive for color change and rash. Negative for  pallor and wound.  Neurological: Negative.      Physical Exam Triage Vital Signs ED Triage Vitals  Enc Vitals Group     BP 12/26/20 1011 (!) 137/94     Pulse Rate 12/26/20 1011 (!) 102     Resp 12/26/20 1011 18     Temp 12/26/20 1011 98.4 F (36.9 C)     Temp Source 12/26/20 1011 Oral     SpO2 12/26/20 1011 97 %     Weight --      Height --      Head Circumference --      Peak Flow --      Pain Score 12/26/20 1008 0     Pain Loc --      Pain Edu? --      Excl. in GC? --    No data found.  Updated Vital Signs BP (!) 137/94 (BP Location: Right Arm)   Pulse (!) 102   Temp 98.4 F (36.9 C) (Oral)   Resp 18   SpO2 97%   Visual Acuity Right Eye Distance:   Left Eye Distance:   Bilateral Distance:    Right Eye Near:   Left Eye Near:     Bilateral Near:     Physical Exam Vitals and nursing note reviewed.  Constitutional:      General: He is not in acute distress.    Appearance: Normal appearance. He is not ill-appearing.  Cardiovascular:     Rate and Rhythm: Normal rate and regular rhythm.     Pulses: Normal pulses.     Heart sounds: Normal heart sounds.  Skin:    Comments: Papule with surrounding erythema on the bridge of the nose.  Swelling in the infraorbital areas as well as the frontal scalp.  Extraocular movements are intact.  No pain associated with EOM.  No swelling in the upper eyelids.  No conjunctival injection.  Neurological:     Mental Status: He is alert.      UC Treatments / Results  Labs (all labs ordered are listed, but only abnormal results are displayed) Labs Reviewed - No data to display  EKG   Radiology No results found.  Procedures Procedures (including critical care time)  Medications Ordered in UC Medications - No data to display  Initial Impression / Assessment and Plan / UC Course  I have reviewed the triage vital signs and the nursing notes.  Pertinent labs & imaging results that were available during my care of the patient were reviewed by me and considered in my medical decision making (see chart for details).     1.  Superficial inflammatory acne vulgaris with surrounding erythema: Lesion is worrisome for an infection.  Given the location of the lesion I will start the patient on antibiotics-doxycycline 100 mg twice daily for 7 days Tylenol or Motrin as needed for pain Clindamycin gel as needed Return precautions given-if patient notices worsening swelling, pain, double vision or confusion. Final Clinical Impressions(s) / UC Diagnoses   Final diagnoses:  Superficial inflammatory acne vulgaris     Discharge Instructions     Medications as tolerated If you have worsening swelling, redness, purulent discharge or worsening pain-please return to the urgent care to  be reevaluated    ED Prescriptions    Medication Sig Dispense Auth. Provider   doxycycline (VIBRAMYCIN) 100 MG capsule Take 1 capsule (100 mg total) by mouth 2 (two) times daily for 7 days. 14 capsule Rafiel Mecca, Britta Mccreedy, MD  clindamycin (CLINDAGEL) 1 % gel Apply topically 2 (two) times daily. 30 g Permelia Bamba, Britta Mccreedy, MD     PDMP not reviewed this encounter.   Merrilee Jansky, MD 12/26/20 1139

## 2021-02-19 ENCOUNTER — Encounter: Payer: Self-pay | Admitting: Medical

## 2021-02-19 ENCOUNTER — Ambulatory Visit (INDEPENDENT_AMBULATORY_CARE_PROVIDER_SITE_OTHER): Payer: 59 | Admitting: Medical

## 2021-02-19 ENCOUNTER — Other Ambulatory Visit: Payer: Self-pay

## 2021-02-19 VITALS — BP 110/70 | HR 88 | Ht 71.0 in | Wt 130.2 lb

## 2021-02-19 DIAGNOSIS — D72829 Elevated white blood cell count, unspecified: Secondary | ICD-10-CM

## 2021-02-19 DIAGNOSIS — R7309 Other abnormal glucose: Secondary | ICD-10-CM

## 2021-02-19 DIAGNOSIS — Z23 Encounter for immunization: Secondary | ICD-10-CM

## 2021-02-19 DIAGNOSIS — Z0000 Encounter for general adult medical examination without abnormal findings: Secondary | ICD-10-CM

## 2021-02-19 DIAGNOSIS — D649 Anemia, unspecified: Secondary | ICD-10-CM

## 2021-02-19 DIAGNOSIS — Z8249 Family history of ischemic heart disease and other diseases of the circulatory system: Secondary | ICD-10-CM

## 2021-02-19 DIAGNOSIS — L723 Sebaceous cyst: Secondary | ICD-10-CM

## 2021-02-19 DIAGNOSIS — F17200 Nicotine dependence, unspecified, uncomplicated: Secondary | ICD-10-CM

## 2021-02-19 DIAGNOSIS — Z681 Body mass index (BMI) 19 or less, adult: Secondary | ICD-10-CM

## 2021-02-19 DIAGNOSIS — Z131 Encounter for screening for diabetes mellitus: Secondary | ICD-10-CM

## 2021-02-19 DIAGNOSIS — Z13220 Encounter for screening for lipoid disorders: Secondary | ICD-10-CM | POA: Diagnosis not present

## 2021-02-19 DIAGNOSIS — Z7185 Encounter for immunization safety counseling: Secondary | ICD-10-CM

## 2021-02-19 DIAGNOSIS — Z113 Encounter for screening for infections with a predominantly sexual mode of transmission: Secondary | ICD-10-CM

## 2021-02-19 NOTE — Progress Notes (Signed)
Subjective:   HPI  Jonathon Stone is a 40 y.o. male who presents for Chief Complaint  Patient presents with  . nonfasting cpe    Nonfasting cpe. Had breakfast at 8am this morning. Lump under chin for the last 7 years but doesn't hurt    Patient Care Team: Caster Fayette, Cleda Mccreedy as PCP - General (Family Medicine) Sees dentist Sees eye doctor  Concerns: Here for physical.  No particular concerns today.  Last visit he still had abscess healing on the left butt cheek.  He is significantly improved.  He has a concern about a bump under his chin for 7 years and unchanged, no drainage, no pain  Reviewed their medical, surgical, family, social, medication, and allergy history and updated chart as appropriate.  Past Medical History:  Diagnosis Date  . Abscess 09/2020   left buttock  . Smoker     Past Surgical History:  Procedure Laterality Date  . IRRIGATION AND DEBRIDEMENT ABSCESS Left 10/21/2020   Procedure: IRRIGATION AND DEBRIDEMENT LEFT BUTTOCKS ABSCESS;  Surgeon: Emelia Loron, MD;  Location: [facility] OR;  Service: General;  Laterality: Left;    Family History  Problem Relation Age of Onset  . Neuropathy Mother   . Heart attack Father 8       2 MI  . Heart disease Father   . Cancer Maternal Grandmother        lung  . Cancer Paternal Grandmother        bone  . Diabetes Neg Hx   . Hypertension Neg Hx      Current Outpatient Medications:  .  buPROPion (WELLBUTRIN XL) 150 MG 24 hr tablet, Take 1 tablet (150 mg total) by mouth daily., Disp: 30 tablet, Rfl: 1 .  clindamycin (CLINDAGEL) 1 % gel, Apply topically 2 (two) times daily., Disp: 30 g, Rfl: 0  Allergies  Allergen Reactions  . Oxycodone Other (See Comments)    Hallucinations and vomiting     Review of Systems Constitutional: -fever, -chills, -sweats, -unexpected weight change, -decreased appetite, -fatigue Allergy: -sneezing, -itching, -congestion Dermatology: -changing moles, --rash, -lumps ENT:  -runny nose, -ear pain, -sore throat, -hoarseness, -sinus pain, -teeth pain, - ringing in ears, -hearing loss, -nosebleeds Cardiology: -chest pain, -palpitations, -swelling, -difficulty breathing when lying flat, -waking up short of breath Respiratory: -cough, -shortness of breath, -difficulty breathing with exercise or exertion, -wheezing, -coughing up blood Gastroenterology: -abdominal pain, -nausea, -vomiting, -diarrhea, -constipation, -blood in stool, -changes in bowel movement, -difficulty swallowing or eating Hematology: -bleeding, -bruising  Musculoskeletal: -joint aches, -muscle aches, -joint swelling, -back pain, -neck pain, -cramping, -changes in gait Ophthalmology: denies vision changes, eye redness, itching, discharge Urology: -burning with urination, -difficulty urinating, -blood in urine, -urinary frequency, -urgency, -incontinence Neurology: -headache, -weakness, -tingling, -numbness, -memory loss, -falls, -dizziness Psychology: -depressed mood, -agitation, -sleep problems Male GU: no testicular mass, pain, no lymph nodes swollen, no swelling, no rash.  Depression screen [facility] 2/9 02/19/2021 11/25/2020  Decreased Interest 0 0  Down, Depressed, Hopeless 0 0  PHQ - 2 Score 0 0        Objective:  BP 110/70   Pulse 88   Ht 5\' 11"  (1.803 m)   Wt 130 lb 3.2 oz (59.1 kg)   BMI 18.16 kg/m   Wt Readings from Last 3 Encounters:  02/19/21 130 lb 3.2 oz (59.1 kg)  11/25/20 124 lb 9.6 oz (56.5 kg)  10/21/20 125 lb (56.7 kg)    General appearance: alert, no distress, WD/WN, African American  male Skin: Scattered macules, right submandibular region posteriorly with 2 cm raise subcutaneous lump suggestive of unchanged sebaceous cyst, few acne vulgaris lesions of the forehead, otherwise no worrisome lesions HEENT: normocephalic, conjunctiva/corneas normal, sclerae anicteric, PERRLA, EOMi Neck: supple, no lymphadenopathy, no thyromegaly, no masses, normal ROM, no bruits Chest: non tender,  normal shape and expansion Heart: RRR, normal S1, S2, no murmurs Lungs: CTA bilaterally, no wheezes, rhonchi, or rales Abdomen: +bs, soft, non tender, non distended, no masses, no hepatomegaly, no splenomegaly, no bruits Back: non tender, normal ROM, no scoliosis Musculoskeletal: upper extremities non tender, no obvious deformity, normal ROM throughout, lower extremities non tender, no obvious deformity, normal ROM throughout Extremities: no edema, no cyanosis, no clubbing Pulses: 2+ symmetric, upper and lower extremities, normal cap refill Neurological: alert, oriented x 3, CN2-12 intact, strength normal upper extremities and lower extremities, sensation normal throughout, DTRs 2+ throughout, no cerebellar signs, gait normal Psychiatric: normal affect, behavior normal, pleasant  GU: normal male external genitalia,circumcised, nontender, no masses, no hernia, no lymphadenopathy Rectal: Deferred   Assessment and Plan :   Encounter Diagnoses  Name Primary?  . Encounter for health maintenance examination in adult Yes  . Smoker   . Screening for lipid disorders   . Leukocytosis, unspecified type   . Family history of premature CAD   . Anemia, unspecified type   . Elevated glucose   . Screening for diabetes mellitus   . Vaccine counseling   . Screen for STD (sexually transmitted disease)   . Need for Td vaccine   . Need for pneumococcal vaccination   . Sebaceous cyst   . BMI less than 19,adult     This visit was a preventative care visit, also known as wellness visit or routine physical.   Topics typically include healthy lifestyle, diet, exercise, preventative care, vaccinations, sick and well care, proper use of emergency dept and after hours care, as well as other concerns.     Recommendations: Continue to return yearly for your annual wellness and preventative care visits.  This gives [place] a chance to discuss healthy lifestyle, exercise, vaccinations, review your chart record, and  perform screenings where appropriate.  I recommend you see your eye doctor yearly for routine vision care.  I recommend you see your dentist yearly for routine dental care including hygiene visits twice yearly.   Vaccination recommendations were reviewed Immunization History  Administered Date(s) Administered  . PFIZER(Purple Top)SARS-COV-2 Vaccination 03/18/2020, 04/09/2020  . Pneumococcal Polysaccharide-23 02/19/2021  . Td 02/19/2021    I recommend a yearly flu shot in the fall  We updated your pneumonia and tetanus vaccines today.  We recommend a repeat pneumonia vaccine in 5 years, repeat tetanus in 10 years  Counseled on the Td (tetanus, diptheria) vaccine.  Vaccine information sheet given. Td vaccine given after consent obtained.  Counseled on the pneumococcal vaccine.  Vaccine information sheet given.  Pneumococcal vaccine PPSV23 given after consent obtained.    Screening for cancer: Colon cancer screening: Ae 45  Testicular cancer screening You should do a monthly self testicular exam if you are between 62-26 years old  We discussed PSA, prostate exam, and prostate cancer screening risks/benefits.   Age 62  Skin cancer screening: Check your skin regularly for new changes, growing lesions, or other lesions of concern Come in for evaluation if you have skin lesions of concern.  Lung cancer screening: If you have a greater than 20 pack year history of tobacco use, then you may qualify for  lung cancer screening with a chest CT scan.   Please call your insurance company to inquire about coverage for this test.  We currently don't have screenings for other cancers besides breast, cervical, colon, and lung cancers.  If you have a strong family history of cancer or have other cancer screening concerns, please let me know.    Bone health: Get at least 150 minutes of aerobic exercise weekly Get weight bearing exercise at least once weekly Bone density test:   A bone  density test is an imaging test that uses a type of X-ray to measure the amount of calcium and other minerals in your bones.  The test may be used to diagnose or screen you for a condition that causes weak or thin bones (osteoporosis), predict your risk for a broken bone (fracture), or determine how well your osteoporosis treatment is working. The bone density test is recommended for females 65 and older, or females or males <65 if certain risk factors such as thyroid disease, long term use of steroids such as for asthma or rheumatological issues, vitamin D deficiency, estrogen deficiency, family history of osteoporosis, self or family history of fragility fracture in first degree relative.    Heart health: Get at least 150 minutes of aerobic exercise weekly Limit alcohol It is important to maintain a healthy blood pressure and healthy cholesterol numbers  Heart disease screening: Screening for heart disease includes screening for blood pressure, fasting lipids, glucose/diabetes screening, BMI height to weight ratio, reviewed of smoking status, physical activity, and diet.    Goals include blood pressure 120/80 or less, maintaining a healthy lipid/cholesterol profile, preventing diabetes or keeping diabetes numbers under good control, not smoking or using tobacco products, exercising most days per week or at least 150 minutes per week of exercise, and eating healthy variety of fruits and vegetables, healthy oils, and avoiding unhealthy food choices like fried food, fast food, high sugar and high cholesterol foods.    Other tests may possibly include EKG test, CT coronary calcium score, echocardiogram, exercise treadmill stress test.    Medical care options: I recommend you continue to seek care here first for routine care.  We try really hard to have available appointments Monday through Friday daytime hours for sick visits, acute visits, and physicals.  Urgent care should be used for after  hours and weekends for significant issues that cannot wait till the next day.  The emergency department should be used for significant potentially life-threatening emergencies.  The emergency department is expensive, can often have long wait times for less significant concerns, so try to utilize primary care, urgent care, or telemedicine when possible to avoid unnecessary trips to the emergency department.  Virtual visits and telemedicine have been introduced since the pandemic started in 2020, and can be convenient ways to receive medical care.  We offer virtual appointments as well to assist you in a variety of options to seek medical care.    Separate significant issues discussed: Leukocytosis/elevated white cells-pending labs if still elevated we may need to consider other screenings such as chest x-ray, other, possible hematology consult  I strongly recommend you quit smoking  Elevated glucose on prior labs-recheck labs today for diabetes screen  Sebaceous cyst- consider consult with skin surgery center if you would like this removed   Aldous was seen today for nonfasting cpe.  Diagnoses and all orders for this visit:  Encounter for health maintenance examination in adult -     Lipid panel -  Hemoglobin A1c -     Glucose, Random -     CBC with Differential/Platelet -     HIV Antibody (routine testing w rflx) -     RPR -     GC/Chlamydia Probe Amp -     Hepatitis C antibody -     Hepatitis B surface antigen -     TSH  Smoker  Screening for lipid disorders  Leukocytosis, unspecified type -     CBC with Differential/Platelet  Family history of premature CAD -     Lipid panel  Anemia, unspecified type  Elevated glucose -     Hemoglobin A1c  Screening for diabetes mellitus -     Hemoglobin A1c  Vaccine counseling  Screen for STD (sexually transmitted disease) -     HIV Antibody (routine testing w rflx) -     RPR -     GC/Chlamydia Probe Amp -     Hepatitis C  antibody -     Hepatitis B surface antigen  Need for Td vaccine -     Td vaccine greater than or equal to 7yo preservative free IM  Need for pneumococcal vaccination -     Pneumococcal polysaccharide vaccine 23-valent greater than or equal to 2yo subcutaneous/IM  Sebaceous cyst  BMI less than 19,adult -     TSH    Follow-up pending labs, yearly for physical

## 2021-02-20 LAB — CBC WITH DIFFERENTIAL/PLATELET
Basophils Absolute: 0.1 10*3/uL (ref 0.0–0.2)
Basos: 1 %
EOS (ABSOLUTE): 0.4 10*3/uL (ref 0.0–0.4)
Eos: 5 %
Hematocrit: 40.7 % (ref 37.5–51.0)
Hemoglobin: 13.3 g/dL (ref 13.0–17.7)
Immature Grans (Abs): 0 10*3/uL (ref 0.0–0.1)
Immature Granulocytes: 0 %
Lymphocytes Absolute: 4.4 10*3/uL — ABNORMAL HIGH (ref 0.7–3.1)
Lymphs: 51 %
MCH: 27.8 pg (ref 26.6–33.0)
MCHC: 32.7 g/dL (ref 31.5–35.7)
MCV: 85 fL (ref 79–97)
Monocytes Absolute: 0.7 10*3/uL (ref 0.1–0.9)
Monocytes: 9 %
Neutrophils Absolute: 2.9 10*3/uL (ref 1.4–7.0)
Neutrophils: 34 %
Platelets: 481 10*3/uL — ABNORMAL HIGH (ref 150–450)
RBC: 4.78 x10E6/uL (ref 4.14–5.80)
RDW: 13.5 % (ref 11.6–15.4)
WBC: 8.5 10*3/uL (ref 3.4–10.8)

## 2021-02-20 LAB — LIPID PANEL
Chol/HDL Ratio: 4.2 ratio (ref 0.0–5.0)
Cholesterol, Total: 152 mg/dL (ref 100–199)
HDL: 36 mg/dL — ABNORMAL LOW (ref >39)
LDL Chol Calc (NIH): 99 mg/dL (ref 0–99)
Triglycerides: 91 mg/dL (ref 0–149)
VLDL Cholesterol Cal: 17 mg/dL (ref 5–40)

## 2021-02-20 LAB — RPR: RPR Ser Ql: NONREACTIVE

## 2021-02-20 LAB — HEMOGLOBIN A1C
Est. average glucose Bld gHb Est-mCnc: 120 mg/dL
Hgb A1c MFr Bld: 5.8 % — ABNORMAL HIGH (ref 4.8–5.6)

## 2021-02-20 LAB — GC/CHLAMYDIA PROBE AMP
Chlamydia trachomatis, NAA: NEGATIVE
Neisseria Gonorrhoeae by PCR: NEGATIVE

## 2021-02-20 LAB — GLUCOSE, RANDOM: Glucose: 78 mg/dL (ref 65–99)

## 2021-02-20 LAB — TSH: TSH: 0.812 u[IU]/mL (ref 0.450–4.500)

## 2021-02-20 LAB — HEPATITIS B SURFACE ANTIGEN: Hepatitis B Surface Ag: NEGATIVE

## 2021-02-20 LAB — HEPATITIS C ANTIBODY: Hep C Virus Ab: <0.1 s/co ratio (ref 0.0–0.9)

## 2021-02-20 LAB — HIV ANTIBODY (ROUTINE TESTING W REFLEX): HIV Screen 4th Generation wRfx: NONREACTIVE

## 2021-02-21 ENCOUNTER — Other Ambulatory Visit: Payer: Self-pay | Admitting: Medical

## 2021-02-21 MED ORDER — BUPROPION HCL ER (XL) 150 MG PO TB24: 150.0000 mg | ORAL_TABLET | Freq: Every day | ORAL | 3 refills | Status: DC

## 2021-06-20 IMAGING — CT CT PELVIS W/ CM
2 of 3 series · 17 of 46 positions shown, 19 images · IV contrast (Omni 300)
Comparison: None.

CLINICAL DATA: Buttock abscess

EXAM:
CT PELVIS WITH CONTRAST
TECHNIQUE: Multidetector CT imaging of the pelvis was performed using the
standard protocol following the bolus administration of intravenous
contrast.
CONTRAST:  100mL OMNIPAQUE IOHEXOL 300 MG/ML  SOLN

[Series 4: pelvis with 5.0 · axial · 0.88mm/px · z∈[-727,-432]mm · 14 of 69 slices shown, 16 images]
[im 5/69  soft-tissue]
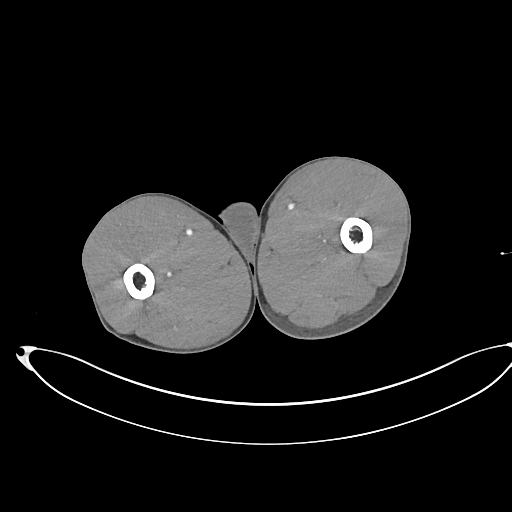
[im 5/69  bone]
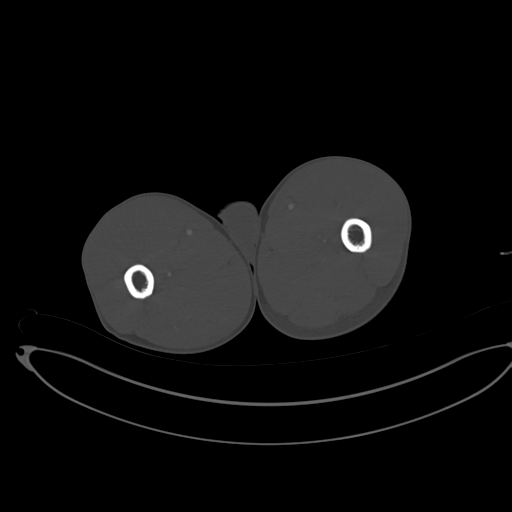
[im 9/69  soft-tissue]
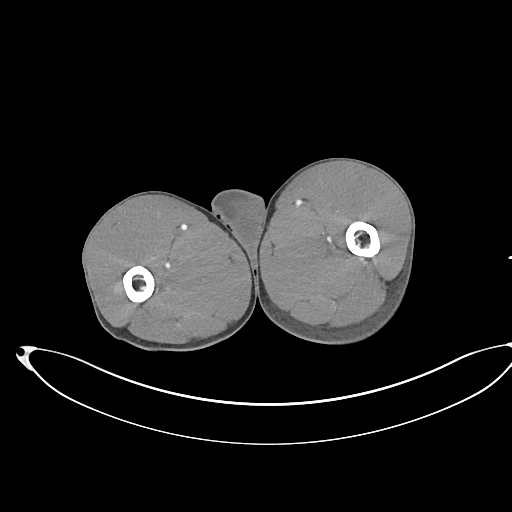
[im 14/69  soft-tissue]
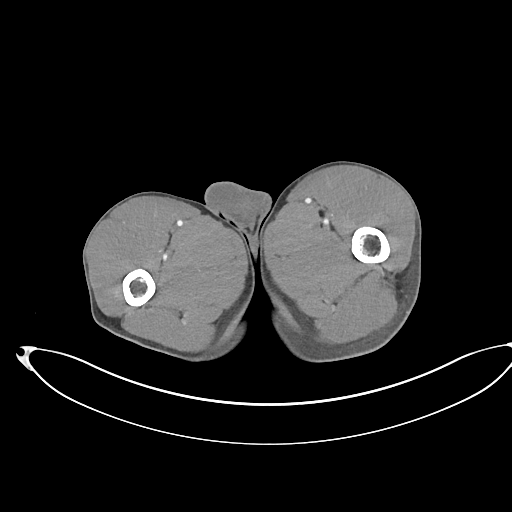
[im 18/69  soft-tissue]
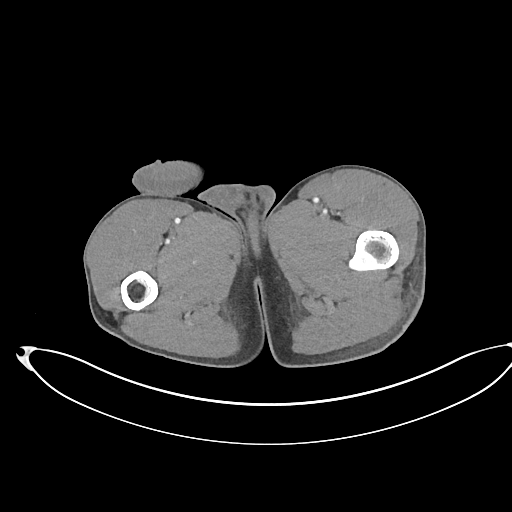
[im 22/69  soft-tissue]
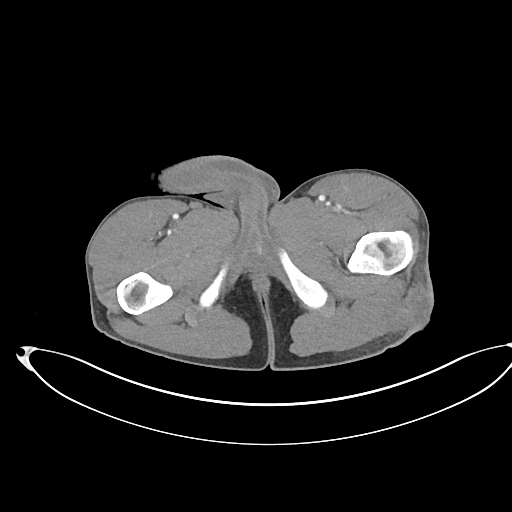
[im 27/69  soft-tissue]
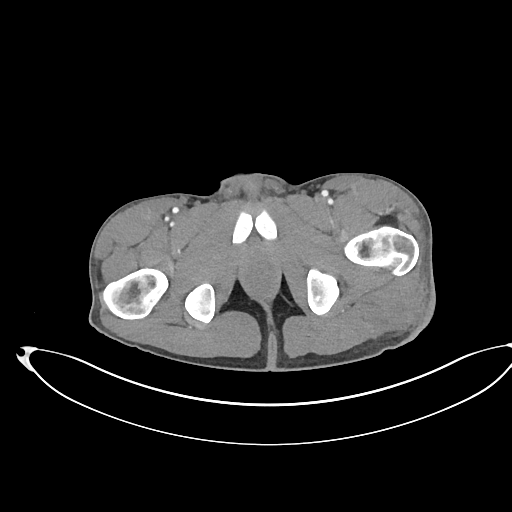
[im 31/69  soft-tissue]
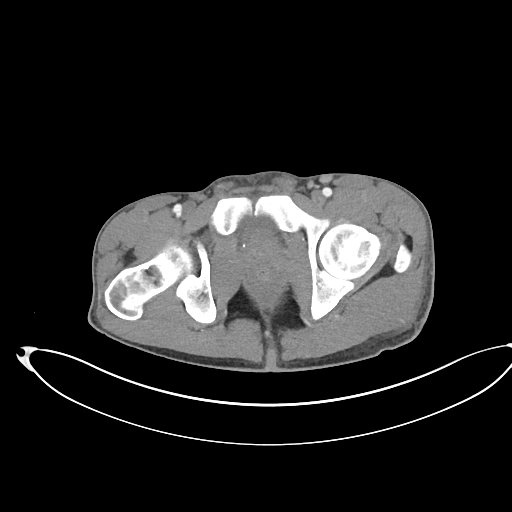
[im 38/69  soft-tissue]
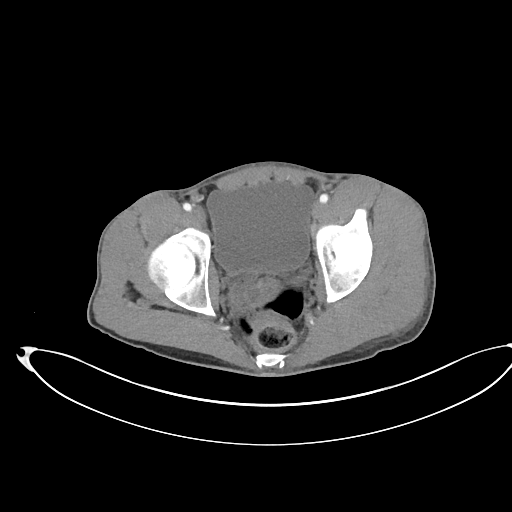
[im 42/69  soft-tissue]
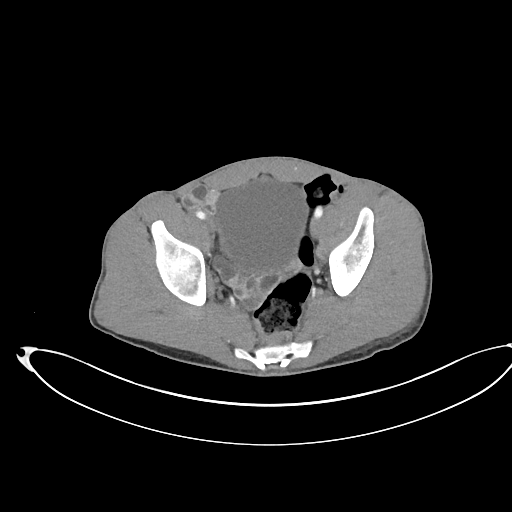
[im 42/69  bone]
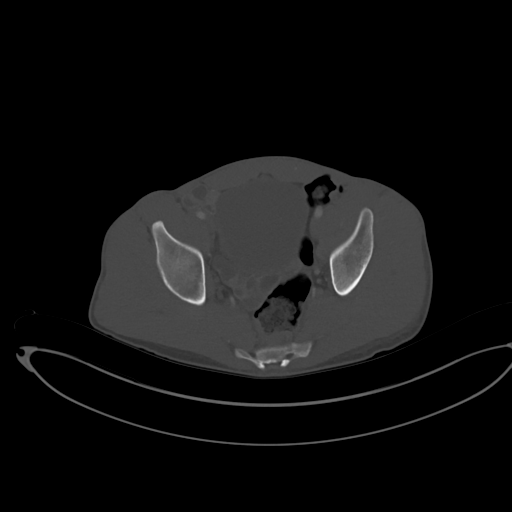
[im 47/69  soft-tissue]
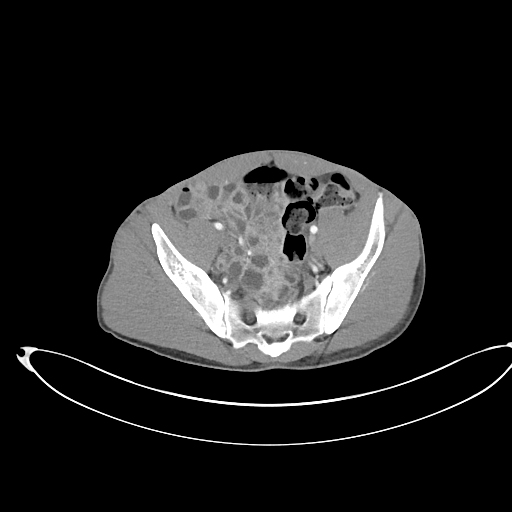
[im 51/69  soft-tissue]
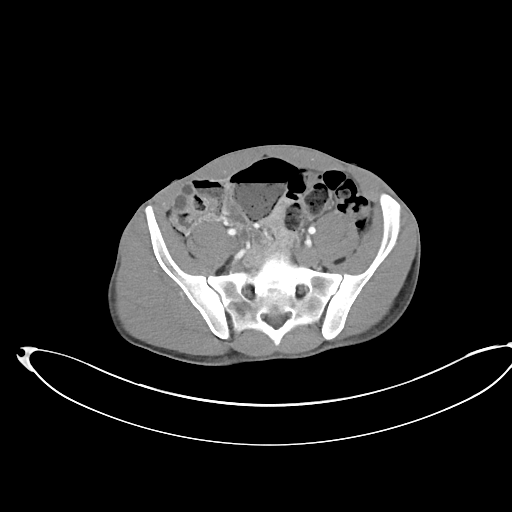
[im 55/69  soft-tissue]
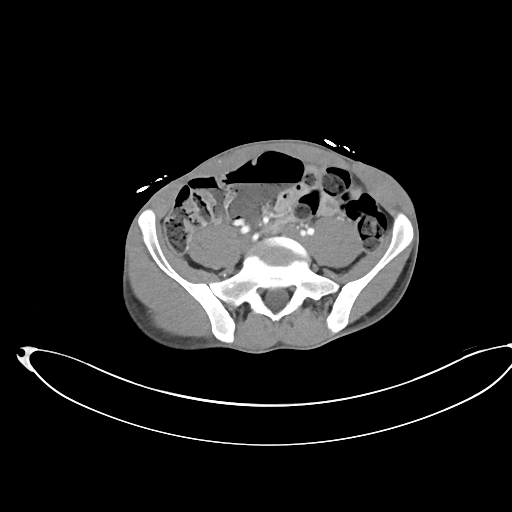
[im 60/69  soft-tissue]
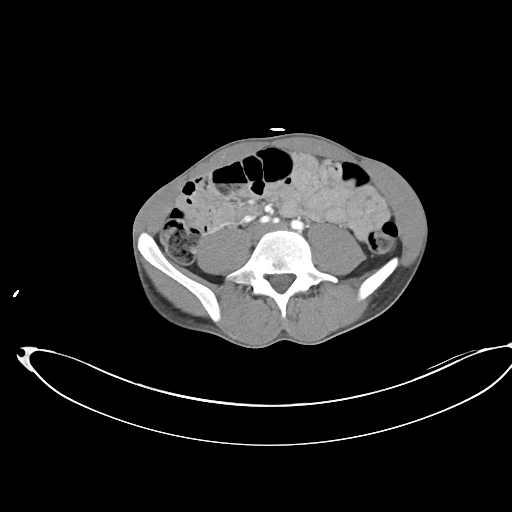
[im 64/69  soft-tissue]
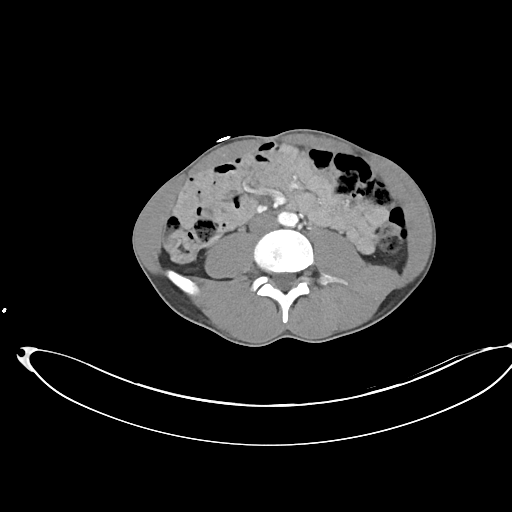

[Series 6: pelvis with 2.0 cor · coronal · 0.68mm/px · 3 of 137 slices shown]
[im 46/137  soft-tissue]
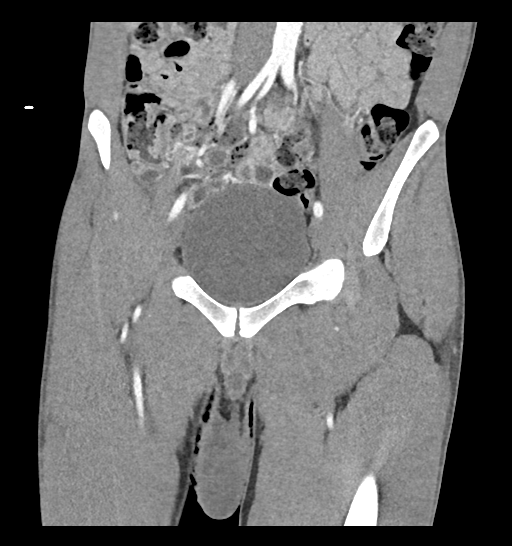
[im 61/137  soft-tissue]
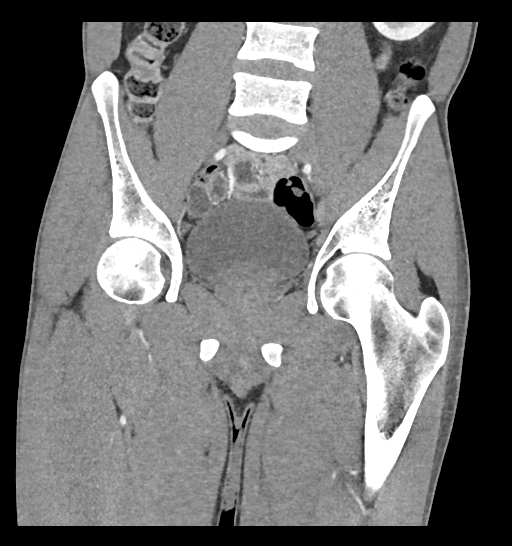
[im 76/137  soft-tissue]
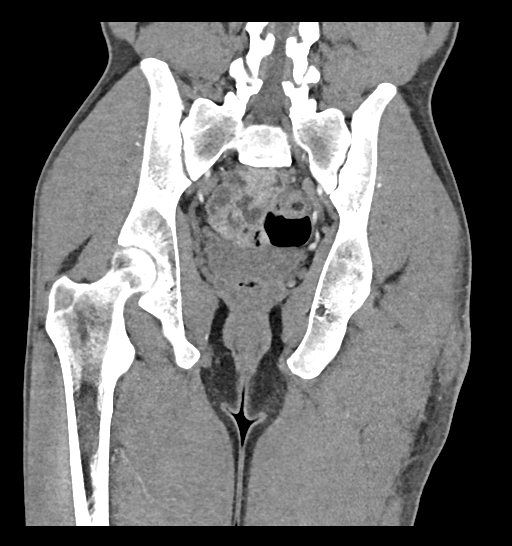

[17 of 46 positions shown; findings below may reference images not displayed]

FINDINGS: Urinary Tract:  No abnormality visualized.

Bowel:  Unremarkable visualized pelvic bowel loops.

Vascular/Lymphatic: No pathologically enlarged lymph nodes. No
significant vascular abnormality seen.

Reproductive:  No mass or other significant abnormality

Other: Subcutaneous fluid collection within the left posterolateral
buttock measures up to 4.5 x 3.1 by 4.0 cm, with peripheral
enhancement consistent with abscess. This is located immediately
deep to the skin surface, and does not extend into the gluteal
musculature. There is surrounding subcutaneous fat stranding
consistent with adjacent cellulitis, extending into the posterior
upper left thigh.

Musculoskeletal: No acute or destructive bony lesions. Reconstructed
images demonstrate no additional findings.
IMPRESSION: 1. Cellulitis of the left gluteal region and proximal left thigh,
with subcutaneous buttock abscess as above.

## 2021-07-02 ENCOUNTER — Encounter (HOSPITAL_COMMUNITY): Payer: Self-pay | Admitting: Emergency Medicine

## 2021-07-02 ENCOUNTER — Other Ambulatory Visit: Payer: Self-pay

## 2021-07-02 ENCOUNTER — Ambulatory Visit (HOSPITAL_COMMUNITY)
Admission: EM | Admit: 2021-07-02 | Discharge: 2021-07-02 | Disposition: A | Discharge status: Home / Self Care | Payer: 59 | Attending: Family Medicine | Admitting: Family Medicine

## 2021-07-02 DIAGNOSIS — L03011 Cellulitis of right finger: Secondary | ICD-10-CM | POA: Diagnosis not present

## 2021-07-02 MED ORDER — DOXYCYCLINE HYCLATE 100 MG PO CAPS: 100.0000 mg | ORAL_CAPSULE | Freq: Two times a day (BID) | ORAL | 0 refills | Status: AC

## 2021-07-02 NOTE — ED Triage Notes (Signed)
Right thumb pain and swelling started 3 days ago.  Patient reports he usually bites hang nails and thinks he pulled hang nail too far.  Reports pus-like drainage from finger this morning.  Swelling, discolored from joint to end of finger

## 2021-07-02 NOTE — Discharge Instructions (Signed)
You had an infection at the base of your fingernail.  We cut this open to drain out the infection.  We will also place you on an antibiotic to treat you.  You need to monitor this closely, see your primary care doctor in 2 to 5 days for follow-up.  If you have significant worsening of pain, infection, fever, you are unable to move your thumb, you need to go to the emergency room right away.  Keep the area clean and dry for the next 24 to 48 hours.  You should keep it covered over the next few days as well to make sure that nothing gets into the open area.  It may continue to drain some pus, this is normal.

## 2021-07-02 NOTE — ED Provider Notes (Signed)
[facility]    CSN: [id number] Arrival date & time: 07/02/21  1660      History   Chief Complaint Chief Complaint  Patient presents with   Hand Pain    HPI Jonathon Stone is a 40 y.o. male.   Right thumb pain and swelling First noticed three days ago Started as a hang nail No fevers Otherwise feeling well Had some pus drain from it last night Able to move his thumb Only has pain over the area of swelling   Past Medical History:  Diagnosis Date   Abscess 09/2020   left buttock   Smoker     Patient Active Problem List   Diagnosis Date Noted   Screening for diabetes mellitus 02/19/2021   Encounter for health maintenance examination in adult 02/19/2021   Screening for lipid disorders 02/19/2021   Vaccine counseling 02/19/2021   Screen for STD (sexually transmitted disease) 02/19/2021   Need for Td vaccine 02/19/2021   Need for pneumococcal vaccination 02/19/2021   Sebaceous cyst 02/19/2021   Cutaneous abscess of buttock 11/25/2020   Smoker 11/25/2020   Encounter for tobacco use cessation counseling 11/25/2020   Leukocytosis 11/25/2020   Elevated glucose 11/25/2020   Family history of premature CAD 11/25/2020   Anemia 11/25/2020    Past Surgical History:  Procedure Laterality Date   IRRIGATION AND DEBRIDEMENT ABSCESS Left 10/21/2020   Procedure: IRRIGATION AND DEBRIDEMENT LEFT BUTTOCKS ABSCESS;  Surgeon: Emelia Loron, MD;  Location: [facility] OR;  Service: General;  Laterality: Left;       Home Medications    Prior to Admission medications   Medication Sig Start Date End Date Taking? Authorizing Provider  doxycycline (VIBRAMYCIN) 100 MG capsule Take 1 capsule (100 mg total) by mouth 2 (two) times daily for 7 days. 07/02/21 07/09/21 Yes Steffany Schoenfelder, Solmon Ice, DO  buPROPion (WELLBUTRIN XL) 150 MG 24 hr tablet Take 1 tablet (150 mg total) by mouth daily. 02/21/21   Tysinger, Kermit Balo, PA-C  clindamycin (CLINDAGEL) 1 % gel Apply topically 2  (two) times daily. 12/26/20   LampteyBritta Mccreedy, MD    Family History Family History  Problem Relation Age of Onset   Neuropathy Mother    Heart attack Father 65       2 MI   Heart disease Father    Cancer Maternal Grandmother        lung   Cancer Paternal Grandmother        bone   Diabetes Neg Hx    Hypertension Neg Hx     Social History Social History   Tobacco Use   Smoking status: Every Day    Packs/day: 0.50    Years: 23.00    Pack years: 11.50    Types: Cigarettes   Smokeless tobacco: Never  Vaping Use   Vaping Use: Never used  Substance Use Topics   Alcohol use: Not Currently   Drug use: Yes    Types: Marijuana     Allergies   Oxycodone   Review of Systems Review of Systems  All other systems reviewed and are negative. Per HPI  Physical Exam Triage Vital Signs ED Triage Vitals  Enc Vitals Group     BP      Pulse      Resp      Temp      Temp src      SpO2      Weight      Height  Head Circumference      Peak Flow      Pain Score      Pain Loc      Pain Edu?      Excl. in GC?    No data found.  Updated Vital Signs BP 114/79 (BP Location: Left Arm)   Pulse (!) 110   Temp 98.4 F (36.9 C) (Oral)   Resp 20   SpO2 97%   Visual Acuity Right Eye Distance:   Left Eye Distance:   Bilateral Distance:    Right Eye Near:   Left Eye Near:    Bilateral Near:     Physical Exam Constitutional:      General: He is not in acute distress.    Appearance: Normal appearance. He is not ill-appearing or toxic-appearing.  Cardiovascular:     Rate and Rhythm: Normal rate and regular rhythm.     Comments: Rate on exam 78 Pulmonary:     Effort: Pulmonary effort is normal. No respiratory distress.  Musculoskeletal:       Hands:  Neurological:     Mental Status: He is alert and oriented to person, place, and time.     UC Treatments / Results  Labs (all labs ordered are listed, but only abnormal results are displayed) Labs Reviewed -  No data to display  EKG   Radiology No results found.  Procedures Incision and Drainage  Date/Time: 07/02/2021 10:16 AM Performed by: Unknown Jim, DO Authorized by: Merrilee Jansky, MD   Consent:    Consent obtained:  Verbal   Consent given by:  Patient   Risks, benefits, and alternatives were discussed: yes     Risks discussed:  Bleeding, incomplete drainage, pain and infection Universal protocol:    Procedure explained and questions answered to patient or proxy's satisfaction: yes     Immediately prior to procedure, a time out was called: yes     Patient identity confirmed:  Verbally with patient and arm band Location:    Type:  Abscess (paronychia)   Size:  1 cm   Location:  Upper extremity   Upper extremity location:  Finger   Finger location:  R thumb Pre-procedure details:    Skin preparation:  Povidone-iodine Anesthesia:    Anesthesia method:  Topical application   Topical anesthesia: ethyl chloride. Procedure type:    Complexity:  Simple Procedure details:    Ultrasound guidance: no     Needle aspiration: no     Incision types:  Stab incision   Incision depth:  Submucosal   Drainage:  Purulent   Drainage amount:  Moderate   Wound treatment:  Wound left open   Packing materials:  None Post-procedure details:    Procedure completion:  Tolerated (including critical care time)  Medications Ordered in UC Medications - No data to display  Initial Impression / Assessment and Plan / UC Course  I have reviewed the triage vital signs and the nursing notes.  Pertinent labs & imaging results that were available during my care of the patient were reviewed by me and considered in my medical decision making (see chart for details).     Patient is a 40 year old male with prior history of MRSA infection who presents with paronychia of right thumb.  Intact range of motion at IP joint, low suspicion for osteomyelitis.  Initial heart rate tachycardic on  exam, however had improvement to 78 prior to discharge, low suspicion for systemic reaction.  Incision and drainage performed  today, he tolerated the procedure well.  Given his prior MRSA infection will give doxycycline for 7 days.  Advise follow-up with his primary care provider in a few days to ensure that he is having improvement.  Given ED precautions. Final Clinical Impressions(s) / UC Diagnoses   Final diagnoses:  Paronychia of finger of right hand     Discharge Instructions      You had an infection at the base of your fingernail.  We cut this open to drain out the infection.  We will also place you on an antibiotic to treat you.  You need to monitor this closely, see your primary care doctor in 2 to 5 days for follow-up.  If you have significant worsening of pain, infection, fever, you are unable to move your thumb, you need to go to the emergency room right away.  Keep the area clean and dry for the next 24 to 48 hours.  You should keep it covered over the next few days as well to make sure that nothing gets into the open area.  It may continue to drain some pus, this is normal.     ED Prescriptions     Medication Sig Dispense Auth. Provider   doxycycline (VIBRAMYCIN) 100 MG capsule Take 1 capsule (100 mg total) by mouth 2 (two) times daily for 7 days. 14 capsule Kaston Faughn, Solmon Ice, DO      PDMP not reviewed this encounter.   Janna Oak, Solmon Ice, DO 07/02/21 1019

## 2021-07-10 ENCOUNTER — Other Ambulatory Visit: Payer: Self-pay

## 2021-07-10 ENCOUNTER — Ambulatory Visit (INDEPENDENT_AMBULATORY_CARE_PROVIDER_SITE_OTHER): Payer: 59 | Admitting: Medical

## 2021-07-10 VITALS — BP 110/70 | HR 121 | Temp 98.2°F | Wt 130.4 lb

## 2021-07-10 DIAGNOSIS — R7301 Impaired fasting glucose: Secondary | ICD-10-CM

## 2021-07-10 DIAGNOSIS — L03011 Cellulitis of right finger: Secondary | ICD-10-CM

## 2021-07-10 DIAGNOSIS — Z8614 Personal history of Methicillin resistant Staphylococcus aureus infection: Secondary | ICD-10-CM

## 2021-07-10 DIAGNOSIS — F17200 Nicotine dependence, unspecified, uncomplicated: Secondary | ICD-10-CM | POA: Diagnosis not present

## 2021-07-10 LAB — POCT GLYCOSYLATED HEMOGLOBIN (HGB A1C): Hemoglobin A1C: 5.3 % (ref 4.0–5.6)

## 2021-07-10 MED ORDER — SULFAMETHOXAZOLE-TRIMETHOPRIM 800-160 MG PO TABS: 1.0000 | ORAL_TABLET | Freq: Two times a day (BID) | ORAL | 0 refills | Status: DC

## 2021-07-10 NOTE — Patient Instructions (Signed)
Paronychia of finger Finish out antibiotic doxycycline I am sending a wound culture today You actually had positive MRSA bacteria back in January 2022 I would like you to go ahead and start a second antibiotic for extra protection and to see if we can help this heal quicker If much worse over the next 4 to 5 days such as worse redness, pain, swelling, numbness or tingling, then go to the emergency department Use salt water soaks of the finger, use good hygiene and handwashing Elevate the hand when possible to help with swelling You can continue ibuprofen over-the-counter 3 tablets over-the-counter twice daily for the next several days Stop smoking

## 2021-07-10 NOTE — Progress Notes (Signed)
Subjective:  Knowledge Escandon is a 40 y.o. male who presents for Chief Complaint  Patient presents with   follow-up    Follow-up on infected finger from UC- feels alittle better but still might need some drained out of infection, declines flu shot     Here for recheck on paronychia.  He was seen 8 days ago at urgent care for the same.  He notes at that time he had a swollen very tender painful finger.  He notes that his improving, significantly improved but not back to normal.  He still feels like there is some pus under the skin.  No fever, body aches or chills, no nausea or vomiting.  He is a smoker.  Chart record shows history of MRSA.  Right handed.  Otherwise normal state of health.  He is still taking the doxycycline antibiotic.  No other aggravating or relieving factors.    No other c/o.  The following portions of the patient's history were reviewed and updated as appropriate: allergies, current medications, past family history, past medical history, past social history, past surgical history and problem list.  ROS Otherwise as in subjective above  Objective: BP 110/70   Pulse (!) 121   Temp 98.2 F (36.8 C)   Wt 130 lb 6.4 oz (59.1 kg)   BMI 18.19 kg/m   General appearance: alert, no distress, well developed, well nourished Right thumb with mild localized swelling of the distal phalanx, there is a pus pocket under the proximal nail bed.  He does have some tenderness of the distal phalanx, no erythema though no warmth Pulses: 2+ radial pulses, 2+ pedal pulses, normal cap refill Ext: no edema   Assessment: Encounter Diagnoses  Name Primary?   Paronychia of right thumb Yes   Impaired fasting blood sugar    History of MRSA infection    Smoker      Plan: Discussed symptoms and concerns.  We discussed small incision and drainage like he had 8 days ago.  He gave consent after discussing risk and benefits.  Cleaned and prepped the finger in usual fashion.  Use  ethyl acetate spray for local anesthesia and used a scalpel to make a small incision.  Pus was expressed, culture taken.  Looking back in his health history he had a MRSA positive abscess back in January 2022 with multiple resistant drugs  He will finish out the doxycycline.  We will await culture  I recommended good hygiene, salt water soapy soaks of the finger, elevation of the finger, continue ibuprofen twice a day for the next few days.   Yaroslav was seen today for follow-up.  Diagnoses and all orders for this visit:  Paronychia of right thumb -     WOUND CULTURE  Impaired fasting blood sugar -     HgB A1c  History of MRSA infection  Smoker  Other orders -     sulfamethoxazole-trimethoprim (BACTRIM DS) 800-160 MG tablet; Take 1 tablet by mouth 2 (two) times daily.   Follow up: pending culture

## 2021-07-17 LAB — WOUND CULTURE

## 2021-07-18 ENCOUNTER — Other Ambulatory Visit: Payer: Self-pay | Admitting: Medical

## 2021-07-18 MED ORDER — CLINDAMYCIN HCL 300 MG PO CAPS: 300.0000 mg | ORAL_CAPSULE | Freq: Three times a day (TID) | ORAL | 0 refills | Status: AC

## 2022-01-19 ENCOUNTER — Telehealth: Payer: Self-pay | Admitting: Medical

## 2022-01-19 ENCOUNTER — Ambulatory Visit (INDEPENDENT_AMBULATORY_CARE_PROVIDER_SITE_OTHER): Payer: 59 | Admitting: Medical

## 2022-01-19 VITALS — BP 104/64 | HR 67 | Temp 97.7°F | Wt 129.2 lb

## 2022-01-19 DIAGNOSIS — Z87891 Personal history of nicotine dependence: Secondary | ICD-10-CM

## 2022-01-19 DIAGNOSIS — R112 Nausea with vomiting, unspecified: Secondary | ICD-10-CM | POA: Diagnosis not present

## 2022-01-19 DIAGNOSIS — R0600 Dyspnea, unspecified: Secondary | ICD-10-CM

## 2022-01-19 DIAGNOSIS — R1010 Upper abdominal pain, unspecified: Secondary | ICD-10-CM | POA: Diagnosis not present

## 2022-01-19 DIAGNOSIS — R630 Anorexia: Secondary | ICD-10-CM | POA: Diagnosis not present

## 2022-01-19 DIAGNOSIS — Z125 Encounter for screening for malignant neoplasm of prostate: Secondary | ICD-10-CM

## 2022-01-19 MED ORDER — ONDANSETRON 4 MG PO TBDP: 4.0000 mg | ORAL_TABLET | Freq: Four times a day (QID) | ORAL | 0 refills | Status: DC | PRN

## 2022-01-19 NOTE — Telephone Encounter (Signed)
Error

## 2022-01-19 NOTE — Progress Notes (Addendum)
Subjective: ? Jonathon Stone is a 41 y.o. male who presents for ?Chief Complaint  ?Patient presents with  ? Abdominal Pain  ?  Abdominal pain since Friday. Can't keep any solid down, right before he throws up his body gets super hot. Has cold symptoms. Has lost a lot of weight  ?   ?Here for not feeling good.  Started 3 days ago not feeling good.  Has had several episodes of vomiting the last 3 days.  He notes abdominal pain in the upper to left upper region.  He quit smoking 2 weeks ago.  He notes since quitting smoking he has felt shortness of breath, has had decreased appetite.  His decreased appetite has been going on for longer than 2 weeks.  He thinks he is lost some weight.  He notes 1 episode of blood in the stool in recent weeks.  No other bowel changes.  No bladder concerns.  No lymph nodes enlarged.  He is worried he has cancer given how bad he feels.  Slowly stopped eating.   ? ?Quit smoking.  2 weeks ago stopped tobacco all together.  Been smoking since age 64yo. ? ?Lives with sister.  He drove himself here today. ? ?No other aggravating or relieving factors.   ? ?No other c/o. ? ?Past Medical History:  ?Diagnosis Date  ? Abscess 09/2020  ? left buttock  ? Smoker   ? ?No current outpatient medications on file prior to visit.  ? ?No current facility-administered medications on file prior to visit.  ? ? ? ?The following portions of the patient's history were reviewed and updated as appropriate: allergies, current medications, past family history, past medical history, past social history, past surgical history and problem list. ? ?ROS ?Otherwise as in subjective above ? ? ?Objective: ?BP 104/64   Pulse 67   Temp 97.7 ?F (36.5 ?C)   Wt 129 lb 3.2 oz (58.6 kg)   BMI 18.02 kg/m?  ? ?Wt Readings from Last 3 Encounters:  ?01/19/22 129 lb 3.2 oz (58.6 kg)  ?07/10/21 130 lb 6.4 oz (59.1 kg)  ?02/19/21 130 lb 3.2 oz (59.1 kg)  ? ?General appearance: alert, lying on tablet, crying at times, ill  appearing ?HEENT: normocephalic, sclerae anicteric, conjunctiva pink and moist, nares patent, no discharge or erythema, pharynx normal ?Oral cavity: MMM, no lesions ?Neck: supple,  2.5cm diameter soft tissue mass under right mandible suggestive of sebaceous cyst, unchanged for years per patient, otherwise no lymphadenopathy, no thyromegaly, no masses ?Heart: RRR, normal S1, S2, no murmurs ?Lungs: CTA bilaterally, no wheezes, rhonchi, or rales ?Abdomen: +bs, soft, tender upper abdomen in general, otherwise non tender, non distended, no masses, no hepatomegaly, no splenomegaly ?Pulses: 2+ radial pulses, 2+ pedal pulses, normal cap refill ?Ext: no edema ? ? ? ?Assessment: ?Encounter Diagnoses  ?Name Primary?  ? Pain of upper abdomen Yes  ? Nausea and vomiting, unspecified vomiting type   ? Decreased appetite   ? Former smoker   ? Dyspnea, unspecified type   ? Screening for prostate cancer   ? ? ? ?Plan: ?Discussed symptoms, concerns, possible causes.  ? ?Patient Instructions  ?I am checking some labs today ? ? ?Please go to [facility] for your chest xray.   Their hours are 8 am - 4:30 pm Monday - Friday.  Take your insurance card with you. ? ?[facility] ?[phone] ? ?[address], [address] ?[place], [place] [address] ? ?[address] ?[place], [place] [address] ? ? ?  Begin Zofran/Ondansetron tablet for nausea.  You can put this in the mouth to dissolve.   You can use this every 4-6 hours as needed for nausea ? ? ?Tonight just use clear fluids.  No solid food today.   You can consume water, soup broth, jello, applesauce, Pedialyte for example.   ? ? ?We will call tomorrow with lab findings.  ? ? ? ? ? ? ? ?Raed was seen today for abdominal pain. ? ?Diagnoses and all orders for this visit: ? ?Pain of upper abdomen ?-     CBC with Differential/Platelet ?-     Comprehensive metabolic panel ?-     PSA ?-     TSH ?-     Lipase ?-     HIV Antibody (routine testing w rflx) ? ?Nausea and vomiting,  unspecified vomiting type ?-     CBC with Differential/Platelet ?-     Comprehensive metabolic panel ?-     PSA ?-     TSH ?-     Lipase ?-     HIV Antibody (routine testing w rflx) ? ?Decreased appetite ?-     DG Chest 2 View; Future ?-     CBC with Differential/Platelet ?-     Comprehensive metabolic panel ?-     PSA ?-     TSH ?-     Lipase ?-     HIV Antibody (routine testing w rflx) ? ?Former smoker ?-     DG Chest 2 View; Future ?-     CBC with Differential/Platelet ?-     Comprehensive metabolic panel ?-     PSA ?-     TSH ?-     Lipase ?-     HIV Antibody (routine testing w rflx) ? ?Dyspnea, unspecified type ?-     DG Chest 2 View; Future ?-     CBC with Differential/Platelet ?-     Comprehensive metabolic panel ?-     PSA ?-     TSH ?-     Lipase ?-     HIV Antibody (routine testing w rflx) ? ?Screening for prostate cancer ?-     CBC with Differential/Platelet ?-     Comprehensive metabolic panel ?-     PSA ?-     TSH ?-     Lipase ?-     HIV Antibody (routine testing w rflx) ? ? ? ?Follow up: pending labs, xray ?

## 2022-01-19 NOTE — Patient Instructions (Signed)
I am checking some labs today ? ? ?Please go to [facility] for your chest xray.   Their hours are 8 am - 4:30 pm Monday - Friday.  Take your insurance card with you. ? ?[facility] ?[phone] ? ?[address], [address] ?[place], [place] [address] ? ?[address] ?[place], [place] [address] ? ? ?Begin Zofran/Ondansetron tablet for nausea.  You can put this in the mouth to dissolve.   You can use this every 4-6 hours as needed for nausea ? ? ?Tonight just use clear fluids.  No solid food today.   You can consume water, soup broth, jello, applesauce, Pedialyte for example.   ? ? ?We will call tomorrow with lab findings.  ? ? ? ? ? ?

## 2022-01-20 ENCOUNTER — Telehealth: Payer: Self-pay | Admitting: Internal Medicine

## 2022-01-20 ENCOUNTER — Other Ambulatory Visit: Payer: Self-pay

## 2022-01-20 ENCOUNTER — Encounter (HOSPITAL_COMMUNITY): Payer: Self-pay | Admitting: Emergency Medicine

## 2022-01-20 ENCOUNTER — Emergency Department (HOSPITAL_COMMUNITY)
Admission: EM | Admit: 2022-01-20 | Discharge: 2022-01-20 | Disposition: A | Discharge status: Home / Self Care | Payer: 59 | Attending: Emergency Medicine | Admitting: Emergency Medicine

## 2022-01-20 ENCOUNTER — Ambulatory Visit
Admission: RE | Admit: 2022-01-20 | Discharge: 2022-01-20 | Disposition: A | Discharge status: Home / Self Care | Payer: 59 | Source: Ambulatory Visit | Attending: Medical | Admitting: Medical

## 2022-01-20 ENCOUNTER — Other Ambulatory Visit: Payer: Self-pay | Admitting: Medical

## 2022-01-20 DIAGNOSIS — E860 Dehydration: Secondary | ICD-10-CM | POA: Insufficient documentation

## 2022-01-20 DIAGNOSIS — D649 Anemia, unspecified: Secondary | ICD-10-CM

## 2022-01-20 DIAGNOSIS — F17200 Nicotine dependence, unspecified, uncomplicated: Secondary | ICD-10-CM

## 2022-01-20 DIAGNOSIS — R7989 Other specified abnormal findings of blood chemistry: Secondary | ICD-10-CM

## 2022-01-20 DIAGNOSIS — R197 Diarrhea, unspecified: Secondary | ICD-10-CM

## 2022-01-20 DIAGNOSIS — D75839 Thrombocytosis, unspecified: Secondary | ICD-10-CM | POA: Insufficient documentation

## 2022-01-20 DIAGNOSIS — R109 Unspecified abdominal pain: Secondary | ICD-10-CM | POA: Insufficient documentation

## 2022-01-20 DIAGNOSIS — D72829 Elevated white blood cell count, unspecified: Secondary | ICD-10-CM

## 2022-01-20 DIAGNOSIS — Z87891 Personal history of nicotine dependence: Secondary | ICD-10-CM | POA: Insufficient documentation

## 2022-01-20 DIAGNOSIS — R112 Nausea with vomiting, unspecified: Secondary | ICD-10-CM

## 2022-01-20 LAB — HIV ANTIBODY (ROUTINE TESTING W REFLEX): HIV Screen 4th Generation wRfx: NONREACTIVE

## 2022-01-20 LAB — COMPREHENSIVE METABOLIC PANEL
ALT: 14 IU/L (ref 0–44)
ALT: 14 U/L (ref 0–44)
AST: 19 IU/L (ref 0–40)
AST: 18 U/L (ref 15–41)
Albumin/Globulin Ratio: 0.7 — ABNORMAL LOW (ref 1.2–2.2)
Albumin: 3.5 g/dL — ABNORMAL LOW (ref 4.0–5.0)
Albumin: 2.6 g/dL — ABNORMAL LOW (ref 3.5–5.0)
Alkaline Phosphatase: 59 IU/L (ref 44–121)
Alkaline Phosphatase: 49 U/L (ref 38–126)
Anion gap: 13 (ref 5–15)
BUN/Creatinine Ratio: 14 (ref 9–20)
BUN: 11 mg/dL (ref 6–24)
BUN: 12 mg/dL (ref 6–20)
Bilirubin Total: 0.3 mg/dL (ref 0.0–1.2)
CO2: 23 mmol/L (ref 20–29)
CO2: 27 mmol/L (ref 22–32)
Calcium: 9.6 mg/dL (ref 8.7–10.2)
Calcium: 9.6 mg/dL (ref 8.9–10.3)
Chloride: 97 mmol/L (ref 96–106)
Chloride: 98 mmol/L (ref 98–111)
Creatinine, Ser: 0.78 mg/dL (ref 0.76–1.27)
Creatinine, Ser: 0.99 mg/dL (ref 0.61–1.24)
GFR, Estimated: >60 mL/min (ref >60)
Globulin, Total: 4.7 g/dL — ABNORMAL HIGH (ref 1.5–4.5)
Glucose, Bld: 92 mg/dL (ref 70–99)
Glucose: 93 mg/dL (ref 70–99)
Potassium: 4.7 mmol/L (ref 3.5–5.2)
Potassium: 4.2 mmol/L (ref 3.5–5.1)
Sodium: 134 mmol/L (ref 134–144)
Sodium: 138 mmol/L (ref 135–145)
Total Bilirubin: 0.5 mg/dL (ref 0.3–1.2)
Total Protein: 8.2 g/dL (ref 6.0–8.5)
Total Protein: 9.3 g/dL — ABNORMAL HIGH (ref 6.5–8.1)
eGFR: 115 mL/min/{1.73_m2} (ref >59)

## 2022-01-20 LAB — CBC
HCT: 32.2 % — ABNORMAL LOW (ref 39.0–52.0)
Hemoglobin: 11.3 g/dL — ABNORMAL LOW (ref 13.0–17.0)
MCH: 27.8 pg (ref 26.0–34.0)
MCHC: 35.1 g/dL (ref 30.0–36.0)
MCV: 79.1 fL — ABNORMAL LOW (ref 80.0–100.0)
Platelets: 975 10*3/uL (ref 150–400)
RBC: 4.07 MIL/uL — ABNORMAL LOW (ref 4.22–5.81)
RDW: 13.7 % (ref 11.5–15.5)
WBC: 13.7 10*3/uL — ABNORMAL HIGH (ref 4.0–10.5)
nRBC: 0 % (ref 0.0–0.2)

## 2022-01-20 LAB — URINALYSIS, ROUTINE W REFLEX MICROSCOPIC
Bilirubin Urine: NEGATIVE
Glucose, UA: NEGATIVE mg/dL
Hgb urine dipstick: NEGATIVE
Ketones, ur: 20 mg/dL — AB
Leukocytes,Ua: NEGATIVE
Nitrite: NEGATIVE
Protein, ur: 30 mg/dL — AB
Specific Gravity, Urine: >1.046 — ABNORMAL HIGH (ref 1.005–1.030)
pH: 6 (ref 5.0–8.0)

## 2022-01-20 LAB — CBC WITH DIFFERENTIAL/PLATELET
Basophils Absolute: 0 10*3/uL (ref 0.0–0.2)
Basos: 0 %
EOS (ABSOLUTE): 0.2 10*3/uL (ref 0.0–0.4)
Eos: 2 %
Hematocrit: 32 % — ABNORMAL LOW (ref 37.5–51.0)
Hemoglobin: 10.7 g/dL — ABNORMAL LOW (ref 13.0–17.7)
Immature Grans (Abs): 0.1 10*3/uL (ref 0.0–0.1)
Immature Granulocytes: 1 %
Lymphocytes Absolute: 3.3 10*3/uL — ABNORMAL HIGH (ref 0.7–3.1)
Lymphs: 25 %
MCH: 27.4 pg (ref 26.6–33.0)
MCHC: 33.4 g/dL (ref 31.5–35.7)
MCV: 82 fL (ref 79–97)
Monocytes Absolute: 1.4 10*3/uL — ABNORMAL HIGH (ref 0.1–0.9)
Monocytes: 11 %
Neutrophils Absolute: 8.1 10*3/uL — ABNORMAL HIGH (ref 1.4–7.0)
Neutrophils: 61 %
Platelets: 855 10*3/uL (ref 150–450)
RBC: 3.9 x10E6/uL — ABNORMAL LOW (ref 4.14–5.80)
RDW: 13.1 % (ref 11.6–15.4)
WBC: 13.2 10*3/uL — ABNORMAL HIGH (ref 3.4–10.8)

## 2022-01-20 LAB — TSH: TSH: 1.51 u[IU]/mL (ref 0.450–4.500)

## 2022-01-20 LAB — LIPASE, BLOOD: Lipase: 77 U/L — ABNORMAL HIGH (ref 11–51)

## 2022-01-20 LAB — PSA: Prostate Specific Ag, Serum: 1.1 ng/mL (ref 0.0–4.0)

## 2022-01-20 LAB — LIPASE: Lipase: 38 U/L (ref 13–78)

## 2022-01-20 MED ORDER — MORPHINE SULFATE (PF) 4 MG/ML IV SOLN
4.0000 mg | Freq: Once | INTRAVENOUS | Status: AC
  Administered 2022-01-20: 4 mg via INTRAVENOUS
  Filled 2022-01-20: qty 1

## 2022-01-20 MED ORDER — ONDANSETRON HCL 4 MG/2ML IJ SOLN: 4.0000 mg | Freq: Once | INTRAMUSCULAR | Status: DC

## 2022-01-20 MED ORDER — SODIUM CHLORIDE 0.9 % IV BOLUS: 1000.0000 mL | Freq: Once | INTRAVENOUS | Status: DC

## 2022-01-20 MED ORDER — PROMETHAZINE HCL 25 MG PO TABS: 25.0000 mg | ORAL_TABLET | Freq: Four times a day (QID) | ORAL | 0 refills | Status: DC | PRN

## 2022-01-20 MED ORDER — ONDANSETRON 4 MG PO TBDP: 8.0000 mg | ORAL_TABLET | Freq: Once | ORAL | Status: DC

## 2022-01-20 MED ORDER — IOPAMIDOL (ISOVUE-300) INJECTION 61%
100.0000 mL | Freq: Once | INTRAVENOUS | Status: AC | PRN
  Administered 2022-01-20: 100 mL via INTRAVENOUS

## 2022-01-20 MED ORDER — LACTATED RINGERS IV BOLUS
1000.0000 mL | Freq: Once | INTRAVENOUS | Status: AC
  Administered 2022-01-20: 1000 mL via INTRAVENOUS

## 2022-01-20 MED ORDER — HALOPERIDOL LACTATE 5 MG/ML IJ SOLN
5.0000 mg | Freq: Once | INTRAMUSCULAR | Status: AC
  Administered 2022-01-20: 5 mg via INTRAVENOUS
  Filled 2022-01-20: qty 1

## 2022-01-20 MED ORDER — ALUM & MAG HYDROXIDE-SIMETH 200-200-20 MG/5ML PO SUSP
30.0000 mL | Freq: Once | ORAL | Status: AC
  Administered 2022-01-20: 30 mL via ORAL
  Filled 2022-01-20: qty 30

## 2022-01-20 NOTE — ED Notes (Signed)
Assumed care of pt at this time.

## 2022-01-20 NOTE — Discharge Instructions (Addendum)
?  All the results in the ER are normal, labs and imaging. ?You are dehydrated, we have been given IV fluids while here. ?We will send you home with nausea medication. ?We are not sure what is causing your symptoms. ?The workup in the ER is not complete, and is limited to screening for life threatening and emergent conditions only, so please see a primary care doctor for further evaluation. ? ? ?Additionally, your platelet count is elevated.  You will need to follow-up with your primary care doctor in 1 week to ensure your platelet count has improved. ?Of note, you and your primary care doctor should review the blood smear that was run on your platelets today to ensure that there is no pathology. ?

## 2022-01-20 NOTE — ED Provider Triage Note (Signed)
Emergency Medicine Provider Triage Evaluation Note ? ?Jonathon Stone , a 41 y.o. male  was evaluated in triage.  Pt complains of N/V with some diarrhea for the last 5 days.  Unable to keep down food and fluids.  Recently was fighting upper respiratory infection 1 to 2 weeks ago.  Tried ODT Zofran, but the taste of it makes him throw up even more.  Feels significantly dehydrated and weak.  Hx tobacco use.  Denies known fever.  Denies urinary symptoms or constipation.  No Hx of abdominal surgeries. ? ?Review of Systems  ?Positive: As above ?Negative: As above ? ?Physical Exam  ?BP (!) 126/99 (BP Location: Right Arm)   Pulse 82   Temp 99.3 ?F (37.4 ?C) (Oral)   Resp 18   Ht 5\' 9"  (1.753 m)   Wt 58.5 kg   SpO2 100%   BMI 19.05 kg/m?  ?Gen:   Awake, mildly ill-appearing ?Resp:  Normal effort, CTAB ?MSK:   Moves extremities with some difficulty  ?Other:  Actively vomiting, hunched over on the floor.  Abdomen tender of the left upper and lower quadrant. ? ?Medical Decision Making  ?Medically screening exam initiated at 4:39 PM.  Appropriate orders placed.  Duvan Mousel was informed that the remainder of the evaluation will be completed by another provider, this initial triage assessment does not replace that evaluation, and the importance of remaining in the ED until their evaluation is complete. ? ?Labs ordered ?  ?Jonathon Sanderson, PA-C ?01/20/22 1654 ? ?

## 2022-01-20 NOTE — ED Provider Notes (Signed)
?[facility] EMERGENCY DEPARTMENT ?Provider Note ? ? ?CSN: [id number] ?Arrival date & time: 01/20/22  1436 ? ?  ? ?History ? ?Chief Complaint  ?Patient presents with  ? Abdominal Pain  ? ? ?Jonathon Stone is a 41 y.o. male. ? ?HPI ? ?  ? ?Pt presents today for nausea and vomiting. Pt does not have any pertinent past gastrointestinal medical history. Pt states that since Monday (01/12/22) he experienced flu like symptoms that gradually got better throughout the week. Friday (01/16/22) pt stated that he started to feel nauseous, vomiting, and reported an episode of bloody stool. Over the weekend, pt stated that he continued to vomit and had chills. He saw his PCP yesterday and was given Zofran for the nausea, which pt states has not helped. Was able to get a CT this morning ordered by PCP. He states that he has not been able to drink or eat since Friday and lost a significant amount of weight. Hx of smoking, but has stopped 2 weeks ago. Denies hematemesis, fever, SOB, chest pain, hx of GI diseases, travel hx, and has not been any sick contacts.  ? ?Home Medications ?Prior to Admission medications   ?Medication Sig Start Date End Date Taking? Authorizing Provider  ?promethazine (PHENERGAN) 25 MG tablet Take 1 tablet (25 mg total) by mouth every 6 (six) hours as needed for nausea or vomiting. 01/20/22  Yes Derwood Kaplan, MD  ?ondansetron (ZOFRAN-ODT) 4 MG disintegrating tablet Take 1 tablet (4 mg total) by mouth every 6 (six) hours as needed for nausea or vomiting. 01/19/22   Tysinger, Kermit Balo, PA-C  ?   ? ?Allergies    ?Oxycodone   ? ?Review of Systems   ?Review of Systems  ?All other systems reviewed and are negative. ? ?Physical Exam ?Updated Vital Signs ?BP 129/82 (BP Location: Right Arm)   Pulse 81   Temp 99.1 ?F (37.3 ?C)   Resp 19   Ht 5\' 9"  (1.753 m)   Wt 58.5 kg   SpO2 99%   BMI 19.05 kg/m?  ?Physical Exam ?Vitals and nursing note reviewed.  ?Constitutional:   ?   Comments: Cachectic  appearing male  ?HENT:  ?   Head: Atraumatic.  ?Eyes:  ?   General: Scleral icterus present.  ?Cardiovascular:  ?   Rate and Rhythm: Normal rate.  ?Pulmonary:  ?   Effort: Pulmonary effort is normal.  ?Abdominal:  ?   Tenderness: There is abdominal tenderness. There is no guarding or rebound.  ?Musculoskeletal:  ?   Cervical back: Neck supple.  ?Skin: ?   General: Skin is warm.  ?Neurological:  ?   Mental Status: He is alert and oriented to person, place, and time.  ? ? ?ED Results / Procedures / Treatments   ?Labs ?(all labs ordered are listed, but only abnormal results are displayed) ?Labs Reviewed  ?LIPASE, BLOOD - Abnormal; Notable for the following components:  ?    Result Value  ? Lipase 77 (*)   ? All other components within normal limits  ?COMPREHENSIVE METABOLIC PANEL - Abnormal; Notable for the following components:  ? Total Protein 9.3 (*)   ? Albumin 2.6 (*)   ? All other components within normal limits  ?CBC - Abnormal; Notable for the following components:  ? WBC 13.7 (*)   ? RBC 4.07 (*)   ? Hemoglobin 11.3 (*)   ? HCT 32.2 (*)   ? MCV 79.1 (*)   ? Platelets 975 (*)   ?  All other components within normal limits  ?URINALYSIS, ROUTINE W REFLEX MICROSCOPIC - Abnormal; Notable for the following components:  ? Specific Gravity, Urine >1.046 (*)   ? Ketones, ur 20 (*)   ? Protein, ur 30 (*)   ? Bacteria, UA FEW (*)   ? All other components within normal limits  ?PATHOLOGIST SMEAR REVIEW  ? ? ?EKG ?None ? ?Radiology ?CT ABDOMEN PELVIS W CONTRAST ? ?Result Date: 01/20/2022 ?CLINICAL DATA:  Leukocytosis, elevated white blood cells, anemia, left upper quadrant left-sided abdominal pain with nausea and vomiting x4 days. History of left gluteal abscess. EXAM: CT ABDOMEN AND PELVIS WITH CONTRAST TECHNIQUE: Multidetector CT imaging of the abdomen and pelvis was performed using the standard protocol following bolus administration of intravenous contrast. RADIATION DOSE REDUCTION: This exam was performed according to  the departmental dose-optimization program which includes automated exposure control, adjustment of the mA and/or kV according to patient size and/or use of iterative reconstruction technique. CONTRAST:  ISOVUE-300 IOPAMIDOL (ISOVUE-300) INJECTION 61% COMPARISON:  CT pelvis October 16, 2020 FINDINGS: Lower chest: No acute abnormality. Hepatobiliary: Scattered bilobar subcentimeter hypodense hepatic lesions are technically too small to accurately characterize but statistically likely to reflect a benign etiology such as cysts or hemangiomas. Gallbladder is unremarkable. No biliary ductal dilation. Pancreas: No pancreatic ductal dilation or evidence of acute inflammation. Spleen: No splenomegaly or focal splenic lesion. Adrenals/Urinary Tract: Bilateral adrenal glands appear normal. No hydronephrosis. Nonobstructive bilateral renal stones measure up to 3 mm. Kidneys demonstrate symmetric enhancement and excretion of contrast material. Stomach/Bowel: No radiopaque enteric contrast material was administered. Stomach is minimally distended with ingested material and gas without focal wall thickening. No pathologic dilation of small or large bowel. Mild to moderate diffuse colonic wall thickening as well as mild small bowel wall thickening. Appendix is normal. Vascular/Lymphatic: Scattered aortic atherosclerosis without abdominal aortic aneurysm. No pathologically enlarged abdominal or pelvic lymph nodes identified Reproductive: Prostate is unremarkable. Other: Small volume mesenteric and abdominopelvic free fluid. No walled off fluid collections. Musculoskeletal: Minimal skin thickening/subcutaneous stranding associated with the prior left gluteal abscess. No acute osseous abnormality. IMPRESSION: 1. Mild to moderate diffuse colonic wall thickening as well as mild small bowel wall thickening. Findings are most consistent with an infectious or inflammatory enterocolitis. 2. Small volume mesenteric and abdominopelvic  free fluid, favored reactive. No walled off fluid collections. 3. Nonobstructive bilateral renal stones measure up to 3 mm. 4. Minimal skin thickening/subcutaneous stranding associated with the prior left gluteal abscess. Electronically Signed   By: Maudry Mayhew M.D.   On: 01/20/2022 11:18   ? ?Procedures ?Procedures  ? ? ?Medications Ordered in ED ?Medications  ?sodium chloride 0.9 % bolus 1,000 mL (1,000 mLs Intravenous Not Given 01/20/22 1941)  ?morphine (PF) 4 MG/ML injection 4 mg (4 mg Intravenous Given 01/20/22 1937)  ?lactated ringers bolus 1,000 mL (0 mLs Intravenous Stopped 01/20/22 2050)  ?alum & mag hydroxide-simeth (MAALOX/MYLANTA) 200-200-20 MG/5ML suspension 30 mL (30 mLs Oral Given 01/20/22 1941)  ?haloperidol lactate (HALDOL) injection 5 mg (5 mg Intravenous Given 01/20/22 1937)  ? ? ?ED Course/ Medical Decision Making/ A&P ?Clinical Course as of 01/20/22 2301  ?Tue Jan 20, 2022  ?2100 Pt reassessed. Pt's VSS and WNL. Pt's cap refill < 3 seconds. Pt has been hydrated in the ER and now passed po challenge. ?We will discharge with antiemetic. Strict ER return precautions have been discussed and pt will return if he is unable to tolerate fluids and symptoms are getting worse. ? [AN]  ?  ?  Clinical Course User Index ?[AN] Derwood KaplanNanavati, Jahking Lesser, MD  ? ?                        ?Medical Decision Making ?Amount and/or Complexity of Data Reviewed ?Labs: ordered. ? ?Risk ?OTC drugs. ?Prescription drug management. ? ?This patient presents to the ED with chief complaint(s) of nausea, vomiting, abdominal pain and diarrhea for the last 4 to 5 days with pertinent past medical history of ?  Elevated platelets, which has not entirely been worked up, and significant weight loss over the last couple of weeks which further complicates the presenting complaint. The complaint involves an extensive differential diagnosis and treatment options and also carries with it a high risk of complications and morbidity.   ? ?The differential  diagnosis includes : ?Gastroenteritis due to viral infection versus foodborne illness.  Unlikely to be bacterial infection given no fevers, bloody stools. ?Undiagnosed inflammatory bowel disease, IBS. ? ?Additionall

## 2022-01-20 NOTE — ED Triage Notes (Signed)
Patient here POV after battling an URI but now having issues with vomiting, with complaints of abdominal pain left sided lower quadrant pain, and unable to keep any food or drink down. NAD at this time VSS.  ?

## 2022-01-20 NOTE — Telephone Encounter (Signed)
Pt states he told them to make sure CT and chest xray was done but he said he laid down for the whole testing so hes not sure what was told. He said he did not stand for anything for xray.  ?

## 2022-01-21 LAB — PATHOLOGIST SMEAR REVIEW

## 2022-01-22 ENCOUNTER — Other Ambulatory Visit: Payer: Self-pay | Admitting: Medical

## 2022-01-22 ENCOUNTER — Telehealth: Payer: Self-pay | Admitting: Medical

## 2022-01-22 DIAGNOSIS — R112 Nausea with vomiting, unspecified: Secondary | ICD-10-CM

## 2022-01-22 DIAGNOSIS — R109 Unspecified abdominal pain: Secondary | ICD-10-CM

## 2022-01-22 DIAGNOSIS — D72829 Elevated white blood cell count, unspecified: Secondary | ICD-10-CM

## 2022-01-22 DIAGNOSIS — K529 Noninfective gastroenteritis and colitis, unspecified: Secondary | ICD-10-CM

## 2022-01-22 MED ORDER — TRAMADOL HCL 50 MG PO TABS: 50.0000 mg | ORAL_TABLET | Freq: Four times a day (QID) | ORAL | 0 refills | Status: DC | PRN

## 2022-01-22 MED ORDER — PROMETHAZINE HCL 25 MG RE SUPP: 25.0000 mg | Freq: Four times a day (QID) | RECTAL | 0 refills | Status: DC | PRN

## 2022-01-22 NOTE — Progress Notes (Signed)
prom

## 2022-01-22 NOTE — Telephone Encounter (Signed)
Pt called and is requesting a referral to a gastro dr, states he is not feeling any better zofran is not helping, states they did not do a xray at the hosp, they only gave him morphine to help with the pain and sent him home, states he can not keep anything down,  ?Pt would like to see them sooner rather than later,  ?

## 2022-01-23 NOTE — Telephone Encounter (Signed)
Pt was feeling better and was advised of Shane note. KH ?

## 2022-01-28 ENCOUNTER — Encounter: Payer: Self-pay | Admitting: Medical

## 2022-01-28 ENCOUNTER — Ambulatory Visit (INDEPENDENT_AMBULATORY_CARE_PROVIDER_SITE_OTHER): Payer: 59 | Admitting: Medical

## 2022-01-28 VITALS — BP 120/70 | HR 108 | Temp 98.1°F | Wt 125.0 lb

## 2022-01-28 DIAGNOSIS — K529 Noninfective gastroenteritis and colitis, unspecified: Secondary | ICD-10-CM | POA: Diagnosis not present

## 2022-01-28 DIAGNOSIS — R1084 Generalized abdominal pain: Secondary | ICD-10-CM

## 2022-01-28 DIAGNOSIS — K921 Melena: Secondary | ICD-10-CM | POA: Insufficient documentation

## 2022-01-28 DIAGNOSIS — R7989 Other specified abnormal findings of blood chemistry: Secondary | ICD-10-CM

## 2022-01-28 DIAGNOSIS — F17200 Nicotine dependence, unspecified, uncomplicated: Secondary | ICD-10-CM

## 2022-01-28 DIAGNOSIS — R112 Nausea with vomiting, unspecified: Secondary | ICD-10-CM

## 2022-01-28 DIAGNOSIS — R000 Tachycardia, unspecified: Secondary | ICD-10-CM | POA: Diagnosis not present

## 2022-01-28 DIAGNOSIS — D72829 Elevated white blood cell count, unspecified: Secondary | ICD-10-CM

## 2022-01-28 NOTE — Patient Instructions (Signed)
I am glad you are much improved. ? ?I still want you to go for chest x-ray. ? ?Please go to [facility] for your chest xray.   Their hours are 8am - 4:30 pm Monday - Friday.  Take your insurance card with you. ? ?[facility] ?[phone] ? ?[address], [address] ?[place], [place] [address] ? ?[address] ?[place], [place] [address] ? ?You can still use the nausea medicine or pain medicine that was previously prescribed as needed. ? ?Continue efforts to eat small portions of more bland foods, continue the fruits and vegetables you are doing currently.  Hydrate well throughout the day with water ? ?You should be getting a call back about the gastroenterology consult soon.  You still need to see them for further evaluation for the recent colitis you experience. ? ?I am rechecking some of your labs today that were abnormal recently. ? ?Recent abnormal labs included elevated white cells which can be due to infection or inflammation or other.  Recent labs included elevated lipase pancreas enzyme although your pancreas looked normal on exam.  This often is due to alcohol abuse when we see pancreas enzyme elevated, but could be elevated for other reasons as well.  Your platelets are also elevated recently which could be related to infection or inflammation or other causes. ? ? ? ? ?

## 2022-01-28 NOTE — Progress Notes (Signed)
Subjective: ? Jonathon Stone is a 41 y.o. male who presents for ?Chief Complaint  ?Patient presents with  ? forms to fill out for work  ?  Has not returned to work yet.  ?   ?Here for follow up.  I initially saw him in Jan 19, 2022 for acute illness including several episodes of vomiting, nausea, abdominal pain, short of breath, decreased appetite, blood in the stool.  After getting labs and scan we had advised to go to the emergency department on Jan 20, 2022 after speaking with gastroenterology specialist about his case.  He went to the emergency department, but after IV fluids and evaluation he was sent home.  He continued to feel back for several days.  He is here today for follow-up. ? ?In lats few days feeling ok.  Overall improving.  He is now able to keep down some fluids and solid food.  Still trying to keep food down.  Drinking water, eating some fruit, melons.  Keeping down some baked chicken, some vegetables. ?Some starches, but not currently eating rice, fried food, pasta or milk. ? ?I saw him last time he was having loose stools and bloody stool.  For the last 4 days having some solid stool.  In last few days no diarrhea or bloody stool.    Currently abdomen not in pain for the past 2 days.  In recent days no fever, no nausea or vomiting. ? ?He has not yet heard back from consult with gastroenterology appointment. ? ?He never went for chest x-ray ? ?He did smoke right before he came here today. ? ?He needs FMLA paperwork completed as he has not been back to work yet. ? ?No other aggravating or relieving factors.   ? ?No other c/o. ? ?Past Medical History:  ?Diagnosis Date  ? Abscess 09/2020  ? left buttock  ? Smoker   ? ?Current Outpatient Medications on File Prior to Visit  ?Medication Sig Dispense Refill  ? Acetaminophen (TYLENOL EXTRA STRENGTH PO) Take by mouth.    ? promethazine (PHENERGAN) 25 MG suppository Place 1 suppository (25 mg total) rectally every 6 (six) hours as needed for nausea or  vomiting. 12 each 0  ? promethazine (PHENERGAN) 25 MG tablet Take 1 tablet (25 mg total) by mouth every 6 (six) hours as needed for nausea or vomiting. 30 tablet 0  ? Turmeric 500 MG CAPS Take by mouth.    ? ?No current facility-administered medications on file prior to visit.  ? ? ? ?The following portions of the patient's history were reviewed and updated as appropriate: allergies, current medications, past family history, past medical history, past social history, past surgical history and problem list. ? ?ROS ?Otherwise as in subjective above ? ?Objective: ?BP 120/70   Pulse (!) 108   Temp 98.1 ?F (36.7 ?C)   Wt 125 lb (56.7 kg)   BMI 18.46 kg/m?  ? ?Wt Readings from Last 3 Encounters:  ?01/28/22 125 lb (56.7 kg)  ?01/20/22 129 lb (58.5 kg)  ?01/19/22 129 lb 3.2 oz (58.6 kg)  ? ?108 hr 100%, 98.1 temp ? ?General appearance: alert, no distress, well developed, well nourished ?HEENT: normocephalic, sclerae anicteric, conjunctiva pink and moist, TMs pearly, nares patent, no discharge or erythema, pharynx normal ?Oral cavity: MMM, no lesions ?Neck: supple, no lymphadenopathy, no thyromegaly, no masses ?Heart: RRR, normal S1, S2, no murmurs ?Lungs: CTA bilaterally, no wheezes, rhonchi, or rales ?Abdomen: +bs, soft, mild suprapubic tendnerss today, otherwise non tender, non  distended, no masses, no hepatomegaly, no splenomegaly ?Pulses: 2+ radial pulses, 2+ pedal pulses, normal cap refill ?Ext: no edema ? ?CT abdomen pelvis with contrast 01/20/22: ?IMPRESSION: ?1. Mild to moderate diffuse colonic wall thickening as well as mild ?small bowel wall thickening. Findings are most consistent with an ?infectious or inflammatory enterocolitis. ?2. Small volume mesenteric and abdominopelvic free fluid, favored ?reactive. No walled off fluid collections. ?3. Nonobstructive bilateral renal stones measure up to 3 mm. ?4. Minimal skin thickening/subcutaneous stranding associated with ?the prior left gluteal  abscess ? ? ? ?Assessment: ?Encounter Diagnoses  ?Name Primary?  ? Generalized abdominal pain Yes  ? Nausea and vomiting, unspecified vomiting type   ? Colitis   ? Tachycardia   ? Blood in stool   ? Smoker   ? Leukocytosis, unspecified type   ? Elevated platelet count   ? ? ? ?Plan: ?I reviewed back over his last visit notes, the emergency department visit from May 2, CT abdomen pelvis from Jan 20, 2022, as well as recent labs. ? ?He is much improved today compared to his last visit.  Fortunately in the last few days no vomiting, no nausea, no pain. ? ?Recent labs from Jan 20, 2022 shows competency metabolic panel normal except for protein elevated and albumin slightly decreased, lipase was slightly elevated at 77, CBC with elevated white cells and elevated platelets. ? ?Patient Instructions  ?I am glad you are much improved. ? ?I still want you to go for chest x-ray. ? ?[facility] go to [facility] for your chest xray.   Their hours are 8am - 4:30 pm Monday - Friday.  Take your insurance card with you. ? ?[facility] ?[phone] ? ?[address], [address] ?[place], [place] [id number] ? ?[address] ?[place], [place] [address] ? ?You can still use the nausea medicine or pain medicine that was previously prescribed as needed. ? ?Continue efforts to eat small portions of more bland foods, continue the fruits and vegetables you are doing currently.  Hydrate well throughout the day with water ? ?You should be getting a call back about the gastroenterology consult soon.  You still need to see them for further evaluation for the recent colitis you experience. ? ?I am rechecking some of your labs today that were abnormal recently. ? ?Recent abnormal labs included elevated white cells which can be due to infection or inflammation or other.  Recent labs included elevated lipase pancreas enzyme although your pancreas looked normal on exam.  This often is due to alcohol abuse when we see pancreas enzyme elevated,  but could be elevated for other reasons as well.  Your platelets are also elevated recently which could be related to infection or inflammation or other causes. ? ? ? ? ? ?Jonathon Stone was seen today for forms to fill out for work. ? ?Diagnoses and all orders for this visit: ? ?Generalized abdominal pain ?-     CBC with Differential/Platelet ?-     Sedimentation rate ?-     Iron ? ?Nausea and vomiting, unspecified vomiting type ? ?Colitis ?-     CBC with Differential/Platelet ?-     Sedimentation rate ?-     Iron ? ?Tachycardia ? ?Blood in stool ?-     CBC with Differential/Platelet ?-     Sedimentation rate ?-     Iron ? ?Smoker ? ?Leukocytosis, unspecified type ?-     CBC with Differential/Platelet ?-     Sedimentation rate ?-  Iron ? ?Elevated platelet count ?-     CBC with Differential/Platelet ?-     Sedimentation rate ?-     Iron ? ? ?Follow up: with gastroenterology as planned ? ?

## 2022-01-29 LAB — CBC WITH DIFFERENTIAL/PLATELET
Basophils Absolute: 0.1 10*3/uL (ref 0.0–0.2)
Basos: 1 %
EOS (ABSOLUTE): 0.2 10*3/uL (ref 0.0–0.4)
Eos: 2 %
Hematocrit: 34.7 % — ABNORMAL LOW (ref 37.5–51.0)
Hemoglobin: 11.5 g/dL — ABNORMAL LOW (ref 13.0–17.7)
Immature Grans (Abs): 0 10*3/uL (ref 0.0–0.1)
Immature Granulocytes: 0 %
Lymphocytes Absolute: 4.4 10*3/uL — ABNORMAL HIGH (ref 0.7–3.1)
Lymphs: 42 %
MCH: 27.1 pg (ref 26.6–33.0)
MCHC: 33.1 g/dL (ref 31.5–35.7)
MCV: 82 fL (ref 79–97)
Monocytes Absolute: 1.3 10*3/uL — ABNORMAL HIGH (ref 0.1–0.9)
Monocytes: 13 %
Neutrophils Absolute: 4.4 10*3/uL (ref 1.4–7.0)
Neutrophils: 42 %
Platelets: 888 10*3/uL (ref 150–450)
RBC: 4.24 x10E6/uL (ref 4.14–5.80)
RDW: 13.2 % (ref 11.6–15.4)
WBC: 10.4 10*3/uL (ref 3.4–10.8)

## 2022-01-29 LAB — IRON: Iron: 38 ug/dL (ref 38–169)

## 2022-01-29 LAB — SEDIMENTATION RATE: Sed Rate: 47 mm/hr — ABNORMAL HIGH (ref 0–15)

## 2022-02-02 ENCOUNTER — Ambulatory Visit
Admission: RE | Admit: 2022-02-02 | Discharge: 2022-02-02 | Disposition: A | Discharge status: Home / Self Care | Payer: 59 | Source: Ambulatory Visit | Attending: Medical | Admitting: Medical

## 2022-02-02 DIAGNOSIS — R7989 Other specified abnormal findings of blood chemistry: Secondary | ICD-10-CM

## 2022-02-02 DIAGNOSIS — F17200 Nicotine dependence, unspecified, uncomplicated: Secondary | ICD-10-CM

## 2022-02-02 DIAGNOSIS — D72829 Elevated white blood cell count, unspecified: Secondary | ICD-10-CM

## 2022-02-02 DIAGNOSIS — D649 Anemia, unspecified: Secondary | ICD-10-CM

## 2022-02-03 ENCOUNTER — Encounter: Payer: Self-pay | Admitting: Family Medicine

## 2022-02-03 ENCOUNTER — Ambulatory Visit (INDEPENDENT_AMBULATORY_CARE_PROVIDER_SITE_OTHER): Payer: 59 | Admitting: Family Medicine

## 2022-02-03 VITALS — BP 108/70 | HR 96 | Temp 98.6°F | Resp 24 | Wt 128.0 lb

## 2022-02-03 DIAGNOSIS — M940 Chondrocostal junction syndrome [Tietze]: Secondary | ICD-10-CM

## 2022-02-03 NOTE — Patient Instructions (Signed)
Costochondritis  Costochondritis is irritation and swelling (inflammation) of the tissue that connects the ribs to the breastbone (sternum). This tissue is called cartilage. Costochondritis causes pain in the front of the chest. Usually, the pain: Starts slowly. Is in more than one rib. What are the causes? The exact cause of this condition is not always known. It results from stress on the tissue in the affected area. The cause of this stress could be: Chest injury. Exercise or activity, such as lifting. Very bad coughing. What increases the risk? You are more likely to develop this condition if you: Are male. Are 30-40 years old. Recently started a new exercise or work activity. Have low levels of vitamin D. Have a condition that makes you cough often. What are the signs or symptoms? The main symptom of this condition is chest pain. The pain: Usually starts slowly and can be sharp or dull. Gets worse with deep breathing, coughing, or exercise. Gets better with rest. May be worse when you press on the affected area of your ribs and breastbone. How is this treated? This condition usually goes away on its own over time. Your doctor may prescribe an NSAID, such as ibuprofen. This can help reduce pain and inflammation. Treatment may also include: Resting and avoiding activities that make pain worse. Putting heat or ice on the painful area. Doing exercises to stretch your chest muscles. If these treatments do not help, your doctor may inject a numbing medicine to help relieve the pain. Follow these instructions at home: Managing pain, stiffness, and swelling     If told, put ice on the painful area. To do this: Put ice in a plastic bag. Place a towel between your skin and the bag. Leave the ice on for 20 minutes, 2-3 times a day. If told, put heat on the affected area. Do this as often as told by your doctor. Use the heat source that your doctor recommends, such as a moist heat  pack or a heating pad. Place a towel between your skin and the heat source. Leave the heat on for 20-30 minutes. Take off the heat if your skin turns bright red. This is very important if you cannot feel pain, heat, or cold. You may have a greater risk of getting burned. Activity Rest as told by your doctor. Do not do anything that makes your pain worse. This includes any activities that use chest, belly (abdomen), and side muscles. Do not lift anything that is heavier than 10 lb (4.5 kg), or the limit that you are told, until your doctor says that it is safe. Return to your normal activities as told by your doctor. Ask your doctor what activities are safe for you. General instructions Take over-the-counter and prescription medicines only as told by your doctor. Keep all follow-up visits as told by your doctor. This is important. Contact a doctor if: You have chills or a fever. Your pain does not go away or it gets worse. You have a cough that does not go away. Get help right away if: You are short of breath. You have very bad chest pain that is not helped by medicines, heat, or ice. These symptoms may be an emergency. Do not wait to see if the symptoms will go away. Get medical help right away. Call your local emergency services (911 in the [place] Do not drive yourself to the hospital. Summary Costochondritis is irritation and swelling (inflammation) of the tissue that connects the ribs to the breastbone (  sternum). ?This condition causes pain in the front of the chest. ?Treatment may include medicines, rest, heat or ice, and exercises. ?This information is not intended to replace advice given to you by your health care provider. Make sure you discuss any questions you have with your health care provider. ?2 tylenol 4 times per day.  Also take 2 Aleve twice per day.   ?Document Revised: 07/21/2019 Document Reviewed: 07/21/2019 ?Elsevier Patient Education ? 2023 [employer] ? ?

## 2022-02-03 NOTE — Progress Notes (Signed)
? ?  Subjective:  ? ? Patient ID: Jonathon Stone, male    DOB: 04-12-1981, 41 y.o.   MRN: [id number] ? ?HPI ?He complains of a 3-day history of left-sided pleuritic chest pain.  When he takes a deep breath or touches over his lower left costochondral area, the pain is reproduced.  No fever, chills, cough or congestion.  He was recently seen for abdominal pain and is scheduled for a GI evaluation due to an abnormal CT scan. ? ? ?Review of Systems ? ?   ?Objective:  ? Physical Exam ?Alert and complaining of left-sided chest pain.  Tender to palpation over the left fourth or fifth costochondral junction.  Cardiac exam shows regular rhythm without murmurs or gallops.  Lungs are clear to auscultation. ? ? ? ?   ?Assessment & Plan:  ?Costochondritis, acute ?I reassured him that this was musculoskeletal in nature and not related to his heart.  He seems relieved of this.  Recommend 2 Tylenol 4 times per day as well as 2 Aleve twice per day.  He was comfortable with that. ? ?

## 2022-02-04 ENCOUNTER — Encounter: Payer: Self-pay | Admitting: Family Medicine

## 2022-02-20 ENCOUNTER — Encounter: Payer: 59 | Admitting: Medical

## 2022-03-05 LAB — HM COLONOSCOPY

## 2022-03-06 ENCOUNTER — Encounter: Payer: Self-pay | Admitting: Internal Medicine

## 2022-03-06 ENCOUNTER — Encounter: Payer: Self-pay | Admitting: Medical

## 2022-05-19 ENCOUNTER — Encounter: Payer: Self-pay | Admitting: Medical

## 2022-05-19 ENCOUNTER — Ambulatory Visit (INDEPENDENT_AMBULATORY_CARE_PROVIDER_SITE_OTHER): Payer: 59 | Admitting: Medical

## 2022-05-19 VITALS — BP 130/90 | HR 100 | Ht 69.0 in | Wt 128.6 lb

## 2022-05-19 DIAGNOSIS — F430 Acute stress reaction: Secondary | ICD-10-CM | POA: Diagnosis not present

## 2022-05-19 DIAGNOSIS — Z638 Other specified problems related to primary support group: Secondary | ICD-10-CM | POA: Diagnosis not present

## 2022-05-19 DIAGNOSIS — R4586 Emotional lability: Secondary | ICD-10-CM

## 2022-05-19 NOTE — Patient Instructions (Addendum)
Continue plan to establish with counseling.  Call TODAY for appointment.     Hang in there.  You seem like a good father and kind soul.   Spend time in prayer, work out some agreements with your baby's mother to get to see your son regularly.   We discussed ways to deal with stress and anxiety. I recommend regular exercise such as 30 minutes or more most days of the week such as walking running and bicycling I recommend taking some time to meditate or pray daily to help slow racing thoughts. I recommend working on relaxation techniques such as deep breathing exercises in a comfortable position relaxing your body.  There are free Apps on the smart phone for this for example Consider getting a massage Journal or use diary to express your ideas on paper to cope with anxiety and stress Work on time management, use a calendar or plan out things to avoid stressing about things. Find ways to utilize your time to include exercise and personal "me" time. Some people use aromatherapy such as lavender to relax Some people use herbal teas to help calm their mood Spend some time with animals or your pet if you have one Consider seeing a counselor to help deal with anxiety and work on specific techniques

## 2022-05-19 NOTE — Progress Notes (Signed)
Subjective:  Jonathon Stone is a 41 y.o. male who presents for Chief Complaint  Patient presents with   Consult    Needs FMLA paperwork filled out. Alcohol abuse.      Here for concerns.  He notes that he has taken a leave of absence from work.  He is having some hard times with certain family stress.  He and his baby's mother are separated.  He notes that his son is 57 years old.  He has always been there for his son physically, emotionally, taking care of his son.  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  All of the sudden his baby's mother has relocated to [place] and he want to spend the school year with her.  This has really shaken him up as this was unexpected.  He is having a hard time dealing with this.  He notes that he has not drank alcohol in 15 years and recently had a bad night.  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   About 3 weeks ago he decided to drink some alcohol and drank too much.  When he woke up it was actually time to clock and at work and he missed work.  This prompted him to contact human resources.  After discussion he decided to take a little bit of time off work to deal with the stress.  They encouraged him to take some time off work and to do some counseling.  2 weeks ago he spoke with human resources at work.   He was advised to contact health advocate, and this is how the process got started.  He notes that a health advocate through his employee assistance program referred him to [facility].   He is required to do 5 sessions with them.   He is calling them today to schedule his first visit.  Then he has to call the health advocate to notify that he has begun this process.  There is a custody order in place that she doesn't follow.  No prior treatment for depression.  No prior hospitalization for mental health concerns.  No other aggravating or relieving factors.    No other c/o.  The  following portions of the patient's history were reviewed and updated as appropriate: allergies, current medications, past family history, past medical history, past social history, past surgical history and problem list.  ROS Otherwise as in subjective above  Objective: BP (!) 130/90   Pulse 100   Ht 5\' 9"  (1.753 m)   Wt 128 lb 9.6 oz (58.3 kg)   BMI 18.99 kg/m   General appearance: alert, no distress, well developed, well nourished Psych: Pleasant, answers question appropriately    Assessment: Encounter Diagnoses  Name Primary?   Stress due to family tension Yes   Mood change    Acute stress reaction      Plan: We discussed his concerns.  He was currently employee assistance program referred counseling office today to establish care and schedule appointment ASAP.  I completed his FMLA form today.  I will see him back in 2 to 3 weeks.  We did discuss some ways to move forward to start to find some solutions.  Patient Instructions  Continue plan to establish with counseling.  Call TODAY for appointment.     Hang in there.  You seem like a good father and kind soul.   Spend time in prayer, work out  some agreements with your baby's mother to get to see your son regularly.   We discussed ways to deal with stress and anxiety. I recommend regular exercise such as 30 minutes or more most days of the week such as walking running and bicycling I recommend taking some time to meditate or pray daily to help slow racing thoughts. I recommend working on relaxation techniques such as deep breathing exercises in a comfortable position relaxing your body.  There are free Apps on the smart phone for this for example Consider getting a massage Journal or use diary to express your ideas on paper to cope with anxiety and stress Work on time management, use a calendar or plan out things to avoid stressing about things. Find ways to utilize your time to include exercise and personal "me"  time. Some people use aromatherapy such as lavender to relax Some people use herbal teas to help calm their mood Spend some time with animals or your pet if you have one Consider seeing a counselor to help deal with anxiety and work on specific techniques    Jonathon Stone was seen today for consult.  Diagnoses and all orders for this visit:  Stress due to family tension  Mood change  Acute stress reaction    Follow up: 2-3 weeks

## 2022-05-27 ENCOUNTER — Encounter: Payer: Self-pay | Admitting: Internal Medicine

## 2022-06-01 ENCOUNTER — Ambulatory Visit: Payer: 59 | Admitting: Medical

## 2022-06-04 ENCOUNTER — Encounter: Payer: Self-pay | Admitting: Medical

## 2022-06-30 ENCOUNTER — Encounter: Payer: Self-pay | Admitting: Internal Medicine

## 2022-07-14 ENCOUNTER — Other Ambulatory Visit: Payer: Self-pay | Admitting: Medical

## 2022-07-14 MED ORDER — CLINDAMYCIN PHOSPHATE 1 % EX GEL: Freq: Two times a day (BID) | CUTANEOUS | 0 refills | Status: DC

## 2022-09-24 IMAGING — CT CT ABD-PELV W/ CM
2 of 5 series · 11 of 46 positions shown, 12 images · IV contrast (agent unspecified)
Comparison: CT pelvis October 16, 2020

CLINICAL DATA: Leukocytosis, elevated white blood cells, anemia,
left upper quadrant left-sided abdominal pain with nausea and
vomiting x4 days. History of left gluteal abscess.

EXAM:
CT ABDOMEN AND PELVIS WITH CONTRAST
TECHNIQUE: Multidetector CT imaging of the abdomen and pelvis was performed
using the standard protocol following bolus administration of
intravenous contrast.

[Series 2: abd pelvis 5.00 br40 s3 axial · axial · 0.43mm/px · z∈[+1127,+1477]mm · 8 of 91 slices shown, 9 images]
[im 11/91  soft-tissue]
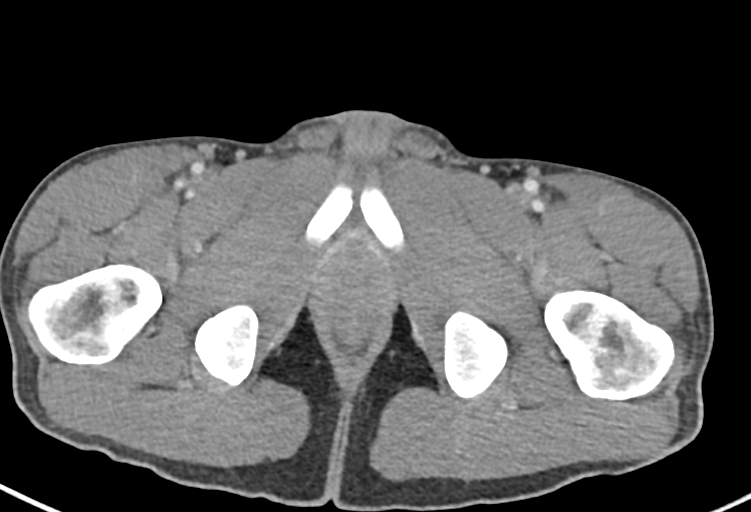
[im 11/91  bone]
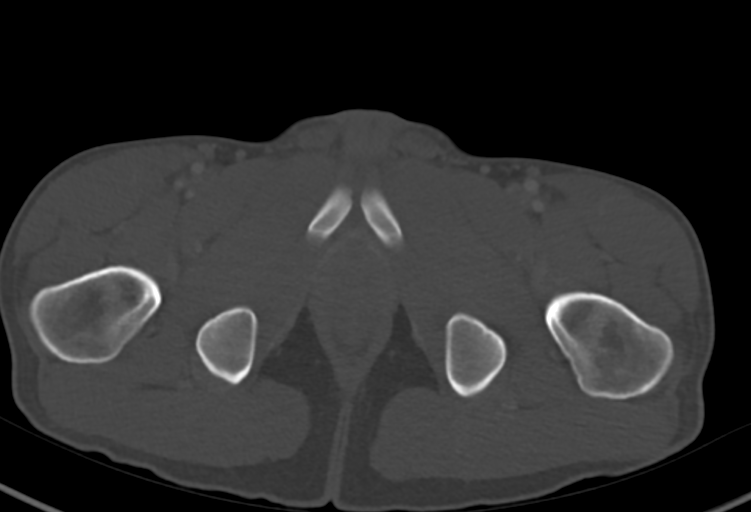
[im 21/91  soft-tissue]
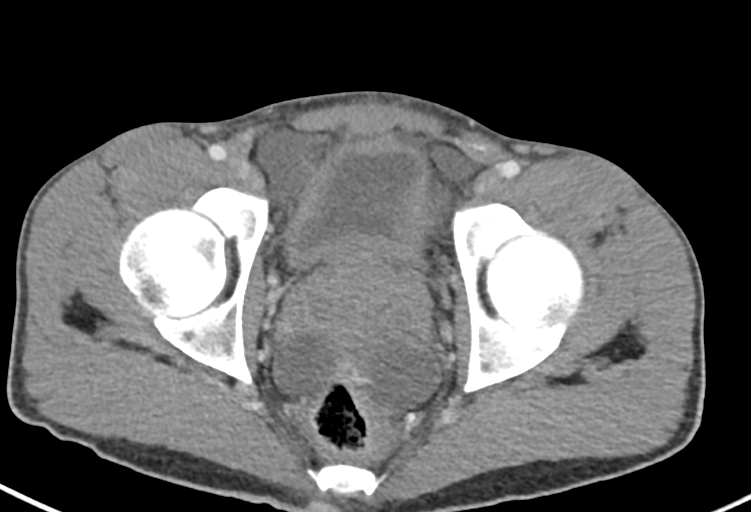
[im 31/91  soft-tissue]
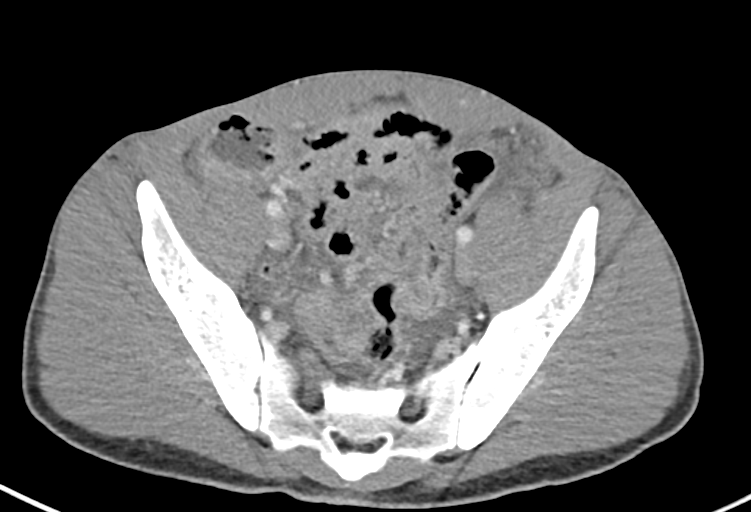
[im 41/91  soft-tissue]
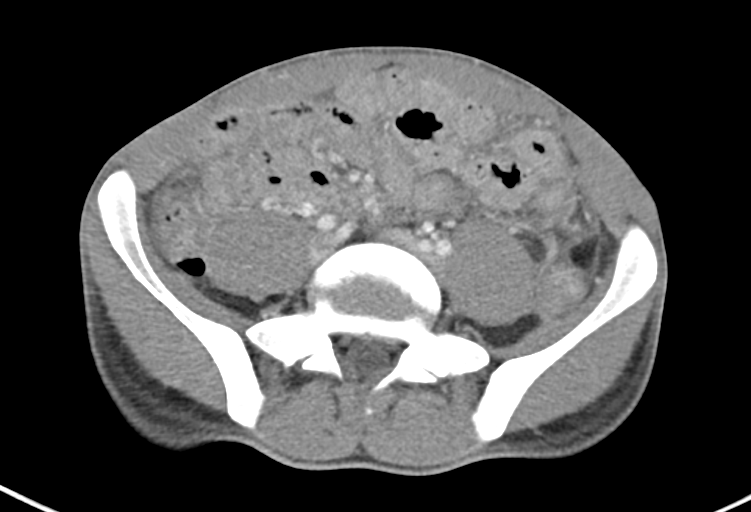
[im 51/91  soft-tissue]
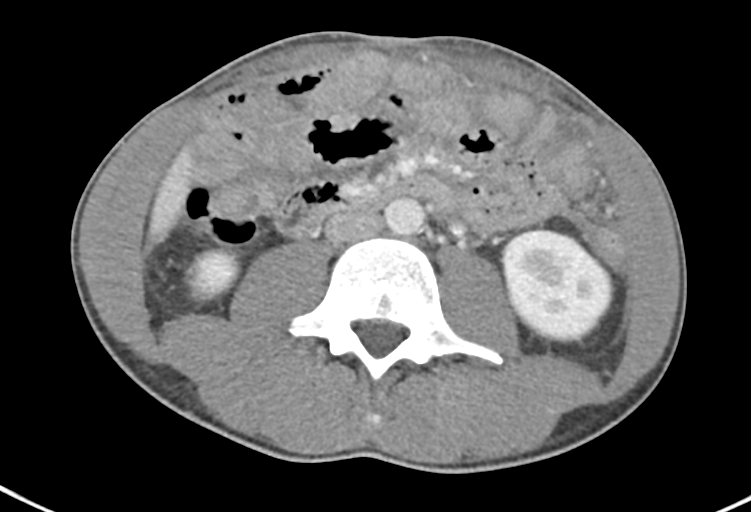
[im 61/91  soft-tissue]
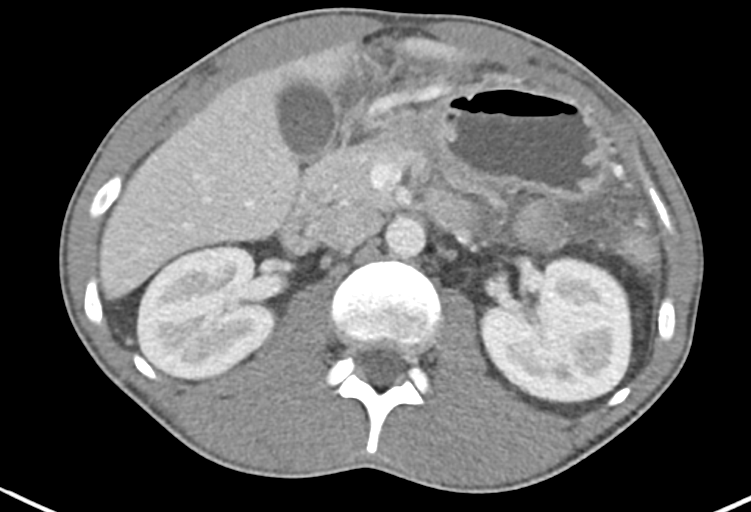
[im 71/91  soft-tissue]
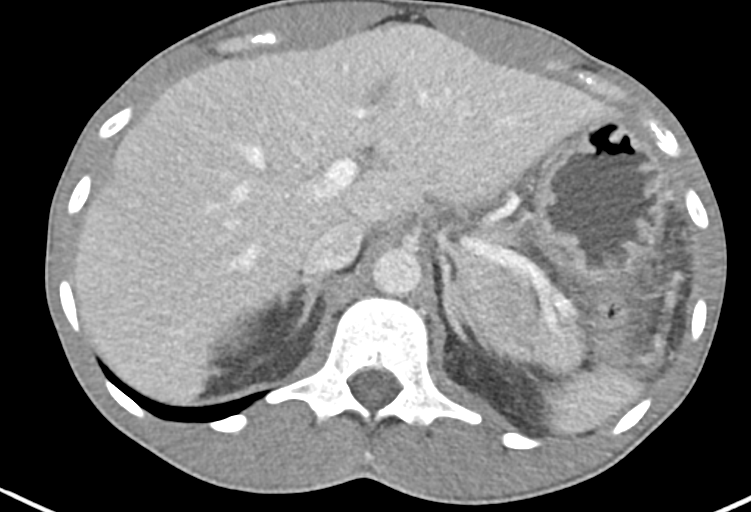
[im 81/91  soft-tissue]
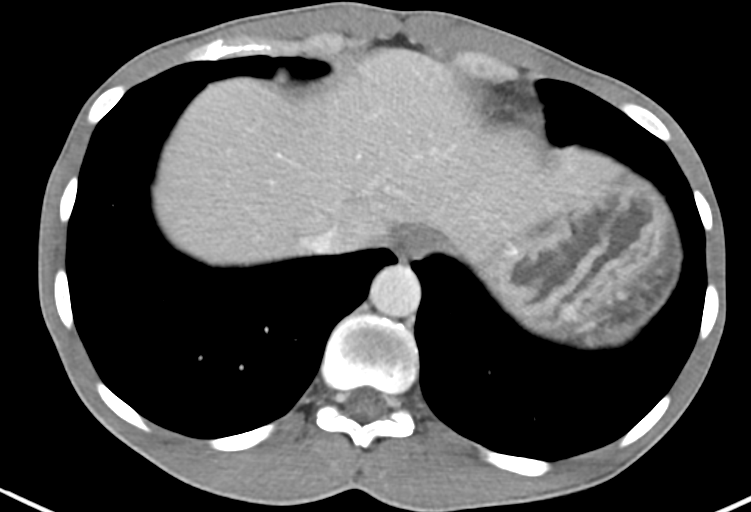

[Series 6: abd pelvis 2.00 br40 s3 cor · coronal · 0.63mm/px · 3 of 122 slices shown]
[im 41/122  soft-tissue]
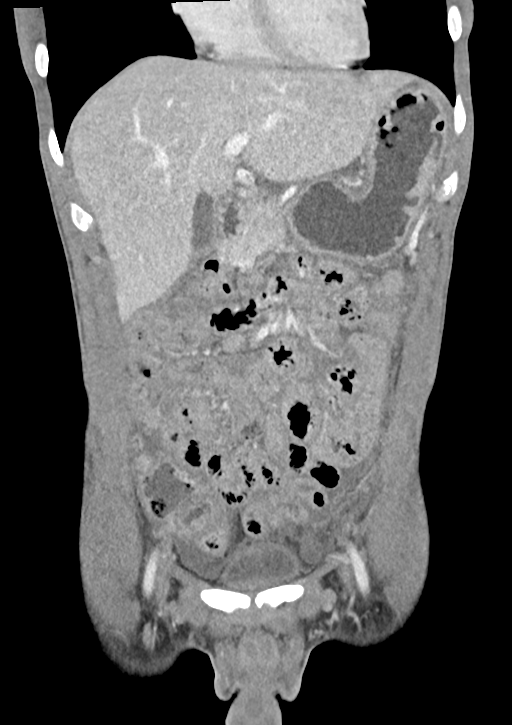
[im 54/122  soft-tissue]
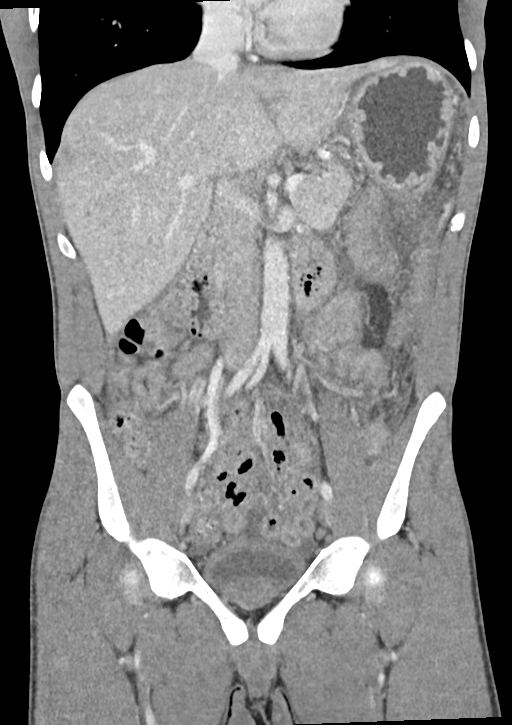
[im 68/122  soft-tissue]
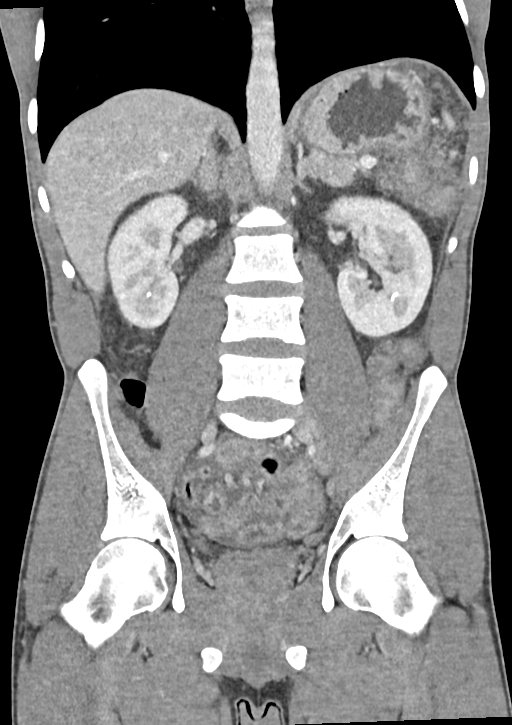

[11 of 46 positions shown; findings below may reference images not displayed]

RADIATION DOSE REDUCTION: This exam was performed according to the
departmental dose-optimization program which includes automated
exposure control, adjustment of the mA and/or kV according to
patient size and/or use of iterative reconstruction technique.

CONTRAST:  100mL [id number]-[id number] IOPAMIDOL (BZ7HQ7-[id number]) INJECTION 61%
FINDINGS: Lower chest: No acute abnormality.

Hepatobiliary: Scattered bilobar subcentimeter hypodense hepatic
lesions are technically too small to accurately characterize but
statistically likely to reflect a benign etiology such as cysts or
hemangiomas. Gallbladder is unremarkable. No biliary ductal
dilation.

Pancreas: No pancreatic ductal dilation or evidence of acute
inflammation.

Spleen: No splenomegaly or focal splenic lesion.

Adrenals/Urinary Tract: Bilateral adrenal glands appear normal. No
hydronephrosis. Nonobstructive bilateral renal stones measure up to
3 mm. Kidneys demonstrate symmetric enhancement and excretion of
contrast material.

Stomach/Bowel: No radiopaque enteric contrast material was
administered. Stomach is minimally distended with ingested material
and gas without focal wall thickening. No pathologic dilation of
small or large bowel. Mild to moderate diffuse colonic wall
thickening as well as mild small bowel wall thickening. Appendix is
normal.

Vascular/Lymphatic: Scattered aortic atherosclerosis without
abdominal aortic aneurysm. No pathologically enlarged abdominal or
pelvic lymph nodes identified

Reproductive: Prostate is unremarkable.

Other: Small volume mesenteric and abdominopelvic free fluid. No
walled off fluid collections.

Musculoskeletal: Minimal skin thickening/subcutaneous stranding
associated with the prior left gluteal abscess. No acute osseous
abnormality.
IMPRESSION: 1. Mild to moderate diffuse colonic wall thickening as well as mild
small bowel wall thickening. Findings are most consistent with an
infectious or inflammatory enterocolitis.
2. Small volume mesenteric and abdominopelvic free fluid, favored
reactive. No walled off fluid collections.
3. Nonobstructive bilateral renal stones measure up to 3 mm.
4. Minimal skin thickening/subcutaneous stranding associated with
the prior left gluteal abscess.

## 2022-10-07 IMAGING — CR DG CHEST 2V
2 series · 2 of 2 positions shown · non-contrast
Comparison: Overlapping portions of CT abdomen from 01/20/2022

CLINICAL DATA: Weight loss. Nausea and vomiting. Leukocytosis.
Shortness of breath. Left-sided chest pain with coughing for 3-4
weeks.

EXAM:
CHEST - 2 VIEW

[w chest pa]
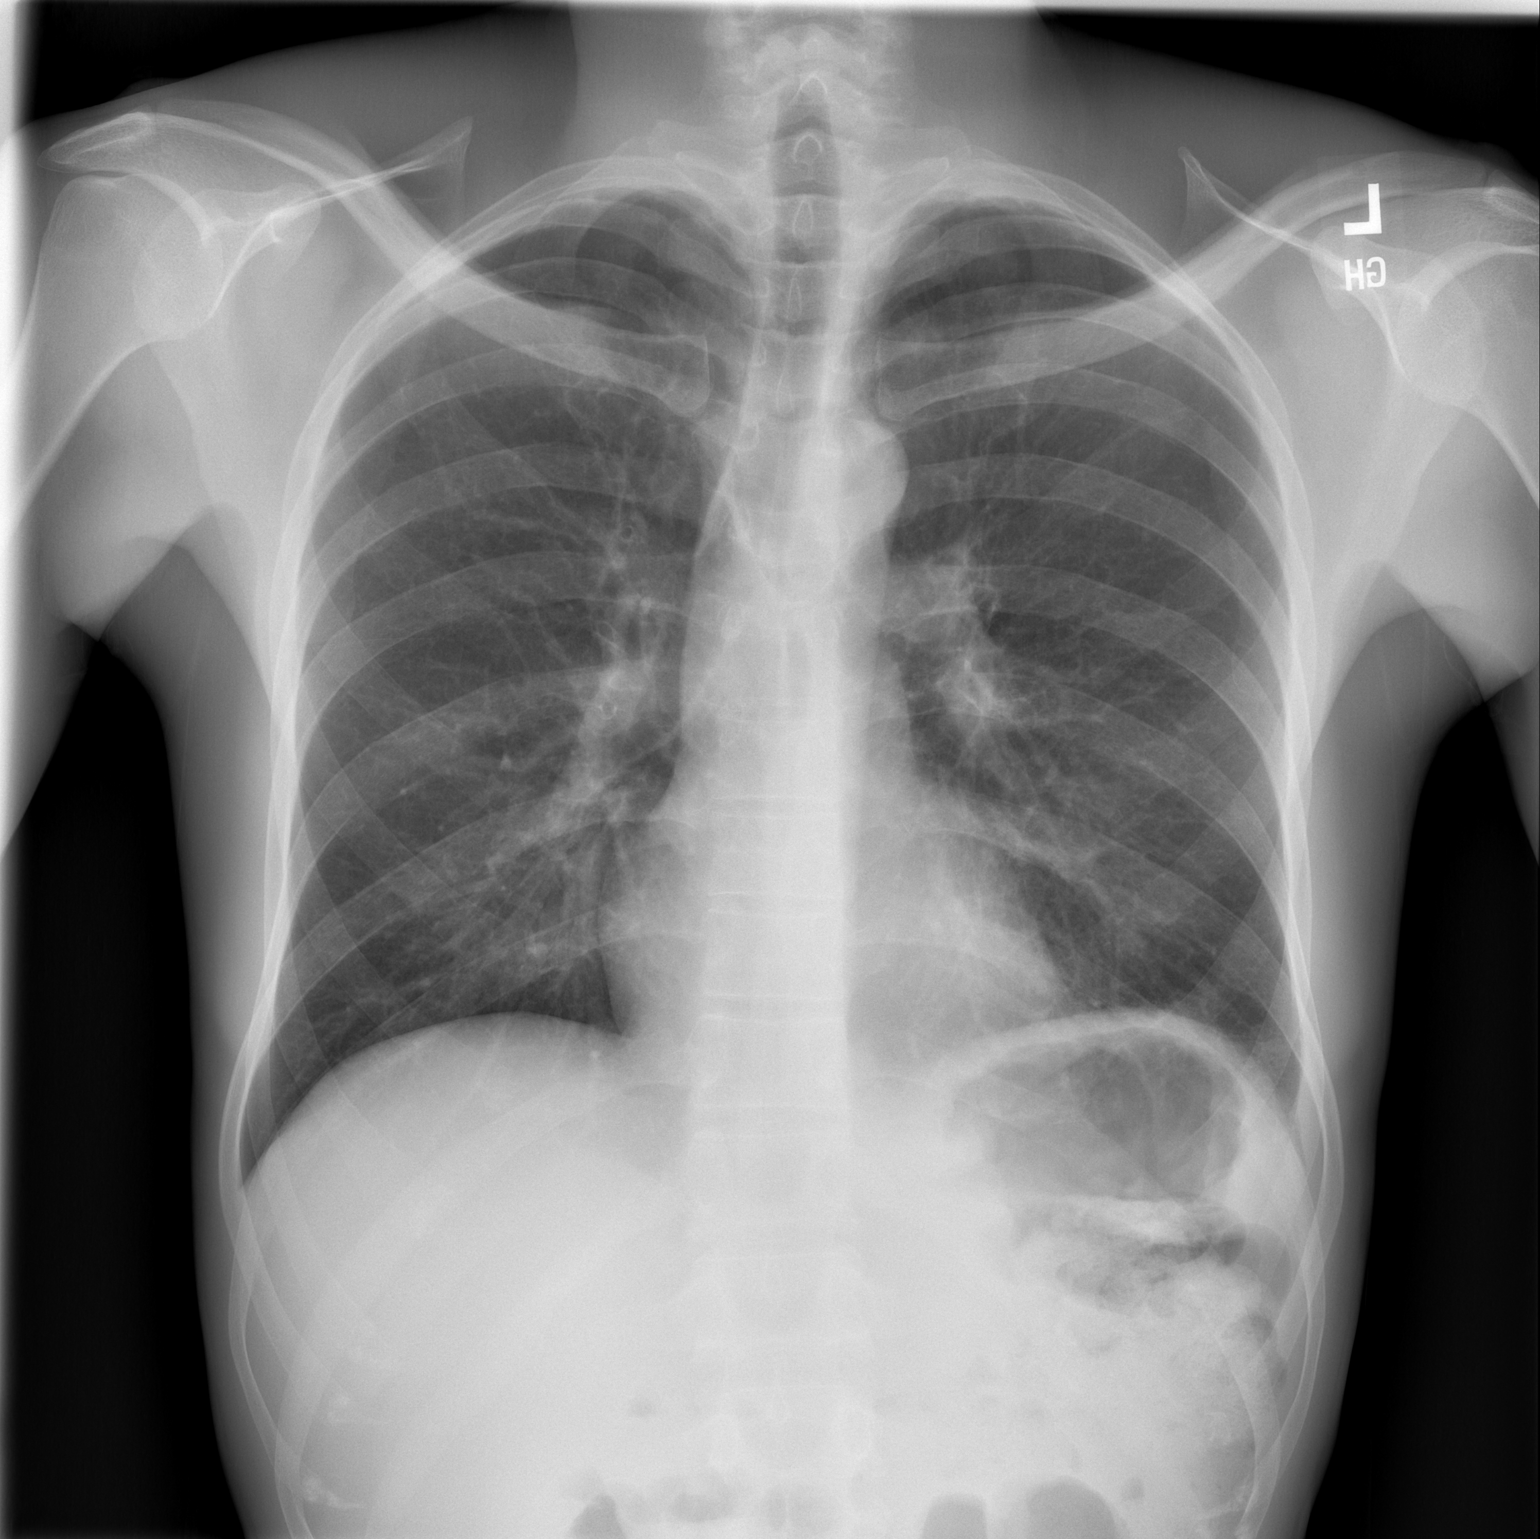

[w chest lat]
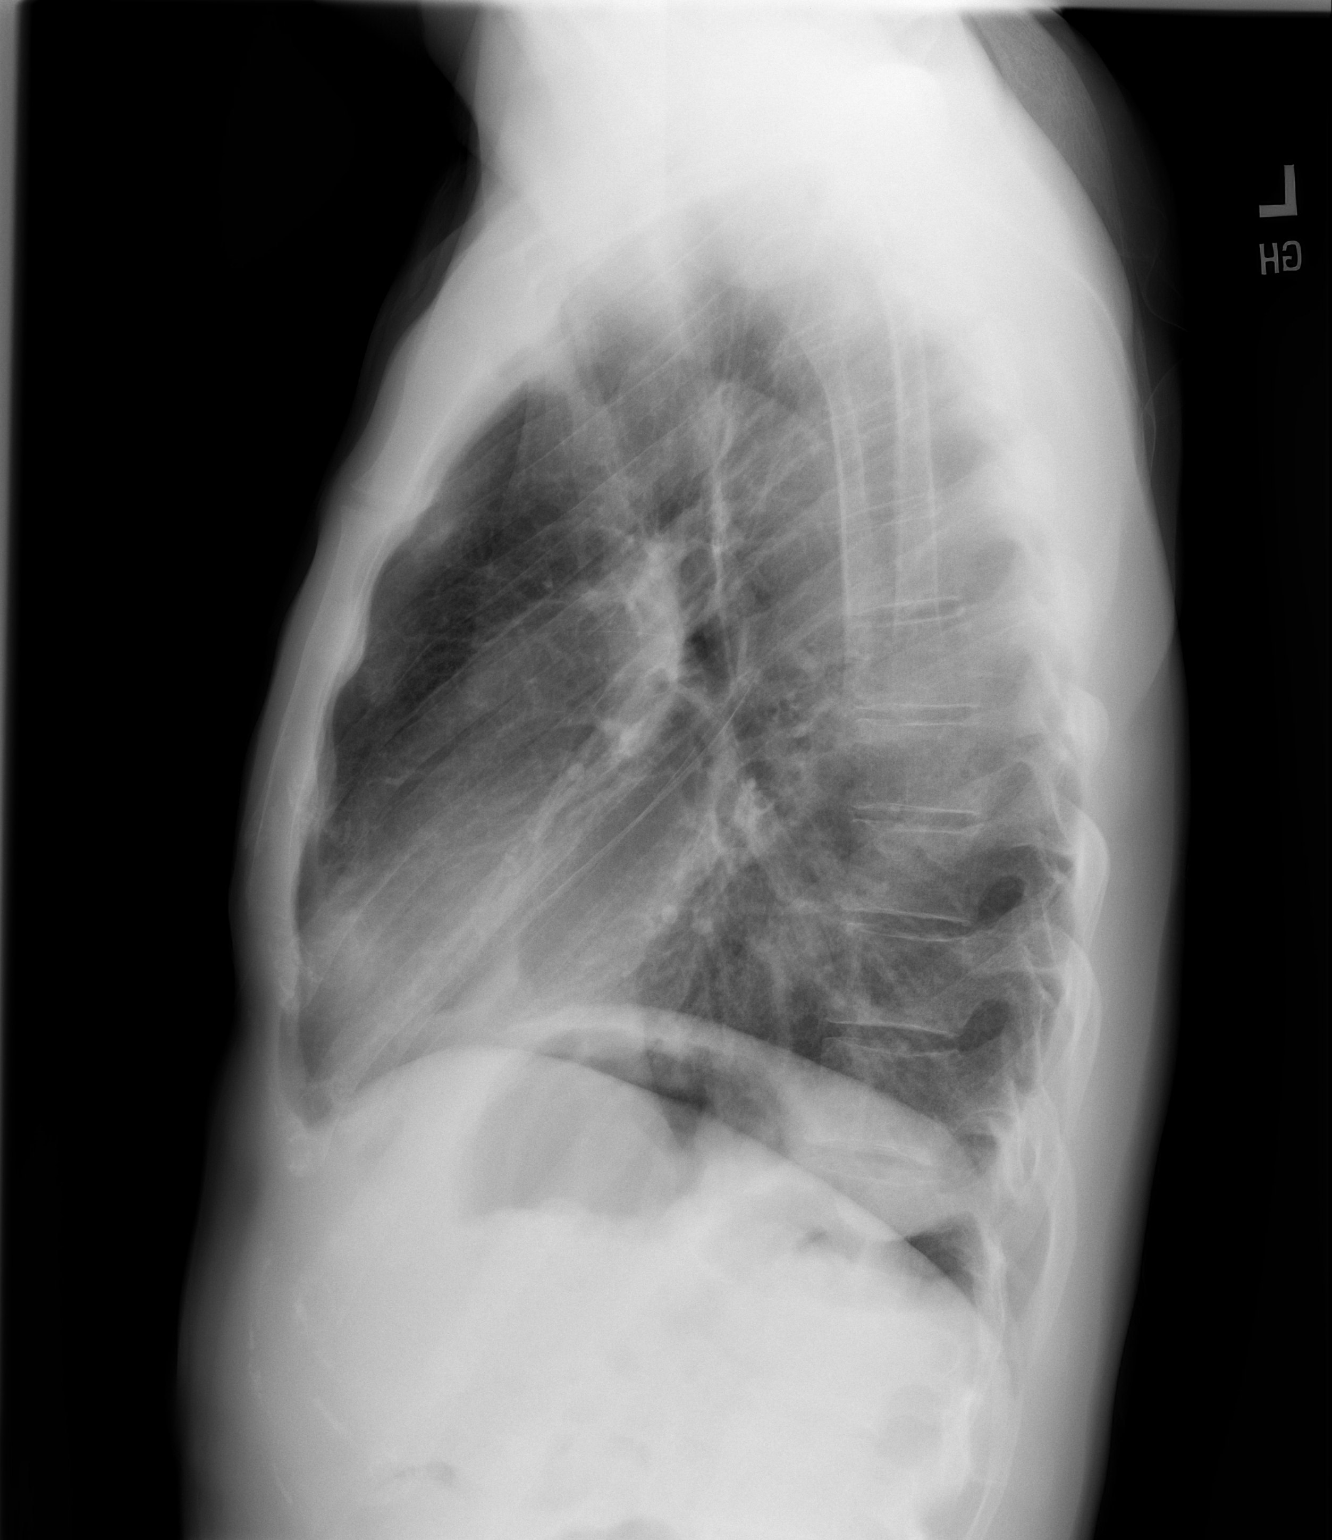

[2 of 2 positions shown; findings below may reference images not displayed]

FINDINGS: The heart size and mediastinal contours are within normal limits.
Both lungs are clear. The visualized skeletal structures are
unremarkable.
IMPRESSION: No active cardiopulmonary disease is radiographically apparent.

## 2023-05-26 ENCOUNTER — Telehealth: Payer: Self-pay | Admitting: Medical

## 2023-05-26 NOTE — Telephone Encounter (Signed)
Elease Hashimoto called from [facility] on [facility]. states pt called for them for appt for throat nodule,  said he has had a couple years & we had sent him to dermatologist & they couldn't do anything for him.  York Spaniel they need referral & chart notes faxed to t# [phone] attn: Elease Hashimoto

## 2023-05-26 NOTE — Telephone Encounter (Signed)
Pt needs an appt as he has not been seen since early in 2023 and this was not mentioned. Please call to schedule pt

## 2023-05-28 NOTE — Telephone Encounter (Signed)
Sent mychart message

## 2023-06-14 NOTE — Telephone Encounter (Signed)
Called pt & scheduled appt

## 2023-06-15 ENCOUNTER — Encounter: Payer: Self-pay | Admitting: Medical

## 2023-06-15 ENCOUNTER — Ambulatory Visit (INDEPENDENT_AMBULATORY_CARE_PROVIDER_SITE_OTHER): Payer: Managed Care, Other (non HMO) | Admitting: Medical

## 2023-06-15 VITALS — BP 120/72 | HR 114 | Ht 69.0 in | Wt 134.6 lb

## 2023-06-15 DIAGNOSIS — Z7251 High risk heterosexual behavior: Secondary | ICD-10-CM | POA: Insufficient documentation

## 2023-06-15 DIAGNOSIS — R220 Localized swelling, mass and lump, head: Secondary | ICD-10-CM | POA: Insufficient documentation

## 2023-06-15 DIAGNOSIS — R7989 Other specified abnormal findings of blood chemistry: Secondary | ICD-10-CM | POA: Insufficient documentation

## 2023-06-15 DIAGNOSIS — Z113 Encounter for screening for infections with a predominantly sexual mode of transmission: Secondary | ICD-10-CM | POA: Diagnosis not present

## 2023-06-15 DIAGNOSIS — Z299 Encounter for prophylactic measures, unspecified: Secondary | ICD-10-CM | POA: Diagnosis not present

## 2023-06-15 DIAGNOSIS — R000 Tachycardia, unspecified: Secondary | ICD-10-CM | POA: Insufficient documentation

## 2023-06-15 DIAGNOSIS — Z720 Tobacco use: Secondary | ICD-10-CM | POA: Insufficient documentation

## 2023-06-15 MED ORDER — CLINDAMYCIN PHOSPHATE 1 % EX GEL: Freq: Two times a day (BID) | CUTANEOUS | 0 refills | Status: DC

## 2023-06-15 NOTE — Progress Notes (Signed)
Subjective:  Jonathon Stone is a 42 y.o. male who presents for Chief Complaint  Patient presents with   Consult    Needs referral to ENT to have cyst on jawline removed. Also would like to discuss starting PREP.      Here for a few different concerns.   He notes cyst on right jaw x 5 years, gotten a little bigger over time.  He called ENT to make appt for removal.  Needs formal referral from our office.   Interested in starting PreP.   Wants the protection.  Uses condoms.  Sexual preference women.  Last STD screening a year ago.    He wants a refill of Clindagel.  He is uses in the past for acne and it worked well  Regarding elevated pulse, he almost missed this appointment this morning, he was almost out of gas on the way here and was stressed out, and he smoked a cigarette before he came in.  No other aggravating or relieving factors.    No other c/o.  Past Medical History:  Diagnosis Date   Abscess 09/2020   left buttock   Smoker    Current Outpatient Medications on File Prior to Visit  Medication Sig Dispense Refill   Acetaminophen (TYLENOL EXTRA STRENGTH PO) Take 325 mg by mouth as needed. (Patient not taking: Reported on 06/15/2023)     No current facility-administered medications on file prior to visit.    The following portions of the patient's history were reviewed and updated as appropriate: allergies, current medications, past family history, past medical history, past social history, past surgical history and problem list.  ROS Otherwise as in subjective above    Objective: BP 120/72   Pulse (!) 114   Ht 5\' 9"  (1.753 m)   Wt 134 lb 9.6 oz (61.1 kg)   SpO2 97%   BMI 19.88 kg/m   General appearance: alert, no distress, well developed, well nourished Right mandible region midline, somewhat inferolateral with a 2.5cm raised lump, somewhat spongy, possible cyst vs lipoma Heart, tachycardic otherwise regular rate rhythm, no murmurs Lungs clear Pulses  normal Extremities without edema cyanosis or clubbing Neck supple nontender no lymphadenopathy or thyromegaly    Assessment: Encounter Diagnoses  Name Primary?   Lump on face Yes   Prophylactic measure    Screen for STD (sexually transmitted disease)    High risk heterosexual behavior    Elevated pulse rate    Tobacco use    Elevated platelet count      Plan: Lump on face, right mandible region inferiorly, chronic x 5 years-possible lipoma versus cyst, referral to ENT  We discussed his interest in prep prophylactic measure.  Discussed risk and benefits of medication, proper use of medication, options for therapy.  Advised condom use.  Advised routine follow-up for HIV test and STD screening.  Labs today.  Elevated pulse rate as she was stressed out on the way here this morning and was almost late and ran out of gas almost.  We will plan to recheck soon for physical including EKG and labs  Tobacco use-advised cessation  Looking back he had some abnormal labs last year.  Advise he return soon for physical and labs and EKG  Tandre was seen today for consult.  Diagnoses and all orders for this visit:  Lump on face -     Ambulatory referral to ENT  Prophylactic measure -     Chlamydia/Gonococcus/Trichomonas, NAA -     HIV  Antibody (routine testing w rflx) -     RPR -     Hepatitis C antibody -     Hepatitis B surface antigen -     Hepatitis B core antibody, IgM -     HSV 1 and 2 Ab, IgG  Screen for STD (sexually transmitted disease) -     Chlamydia/Gonococcus/Trichomonas, NAA -     HIV Antibody (routine testing w rflx) -     RPR -     Hepatitis C antibody -     Hepatitis B surface antigen -     Hepatitis B core antibody, IgM -     Cancel: HSV 2 antibody, IgG -     Cancel: HSV 1 antibody, IgG -     HSV 1 and 2 Ab, IgG  High risk heterosexual behavior -     Chlamydia/Gonococcus/Trichomonas, NAA -     HIV Antibody (routine testing w rflx) -     RPR -     Hepatitis  C antibody -     Hepatitis B surface antigen -     Hepatitis B core antibody, IgM -     Cancel: HSV 2 antibody, IgG -     Cancel: HSV 1 antibody, IgG  Elevated pulse rate  Tobacco use  Elevated platelet count  Other orders -     clindamycin (CLINDAGEL) 1 % gel; Apply topically 2 (two) times daily.    Follow up: Soon for physical

## 2023-06-16 ENCOUNTER — Other Ambulatory Visit: Payer: Self-pay | Admitting: Medical

## 2023-06-16 LAB — HEPATITIS C ANTIBODY: Hep C Virus Ab: NONREACTIVE

## 2023-06-16 LAB — HIV ANTIBODY (ROUTINE TESTING W REFLEX): HIV Screen 4th Generation wRfx: NONREACTIVE

## 2023-06-16 LAB — HSV 1 AND 2 AB, IGG
HSV 1 Glycoprotein G Ab, IgG: 29.6 {index} — ABNORMAL HIGH (ref 0.00–0.90)
HSV 2 IgG, Type Spec: <0.91 {index} (ref 0.00–0.90)

## 2023-06-16 LAB — HEPATITIS B CORE ANTIBODY, IGM: Hep B C IgM: NEGATIVE

## 2023-06-16 LAB — HEPATITIS B SURFACE ANTIGEN: Hepatitis B Surface Ag: NEGATIVE

## 2023-06-16 LAB — RPR: RPR Ser Ql: NONREACTIVE

## 2023-06-16 MED ORDER — EMTRICITABINE-TENOFOVIR DF 200-300 MG PO TABS: 1.0000 | ORAL_TABLET | Freq: Every day | ORAL | 2 refills | Status: AC

## 2023-06-16 NOTE — Progress Notes (Signed)
Results sent through MyChart

## 2023-06-18 LAB — CHLAMYDIA/GONOCOCCUS/TRICHOMONAS, NAA
Chlamydia by NAA: NEGATIVE
Gonococcus by NAA: NEGATIVE
Trich vag by NAA: NEGATIVE

## 2023-06-18 NOTE — Progress Notes (Signed)
Results sent through MyChart

## 2023-08-18 ENCOUNTER — Encounter: Payer: Managed Care, Other (non HMO) | Admitting: Medical

## 2023-10-22 ENCOUNTER — Emergency Department (HOSPITAL_COMMUNITY): Payer: Self-pay

## 2023-10-22 ENCOUNTER — Emergency Department (HOSPITAL_COMMUNITY)
Admission: EM | Admit: 2023-10-22 | Discharge: 2023-10-22 | Discharge status: Against Medical Advice | Payer: Self-pay | Attending: Emergency Medicine | Admitting: Emergency Medicine

## 2023-10-22 ENCOUNTER — Encounter (HOSPITAL_COMMUNITY): Payer: Self-pay

## 2023-10-22 ENCOUNTER — Other Ambulatory Visit: Payer: Self-pay

## 2023-10-22 DIAGNOSIS — R079 Chest pain, unspecified: Secondary | ICD-10-CM | POA: Diagnosis present

## 2023-10-22 DIAGNOSIS — R110 Nausea: Secondary | ICD-10-CM | POA: Diagnosis not present

## 2023-10-22 DIAGNOSIS — Z5321 Procedure and treatment not carried out due to patient leaving prior to being seen by health care provider: Secondary | ICD-10-CM | POA: Insufficient documentation

## 2023-10-22 LAB — CBC
HCT: 39 % (ref 39.0–52.0)
Hemoglobin: 12.6 g/dL — ABNORMAL LOW (ref 13.0–17.0)
MCH: 27.9 pg (ref 26.0–34.0)
MCHC: 32.3 g/dL (ref 30.0–36.0)
MCV: 86.5 fL (ref 80.0–100.0)
Platelets: 466 10*3/uL — ABNORMAL HIGH (ref 150–400)
RBC: 4.51 MIL/uL (ref 4.22–5.81)
RDW: 14.6 % (ref 11.5–15.5)
WBC: 7.4 10*3/uL (ref 4.0–10.5)
nRBC: 0 % (ref 0.0–0.2)

## 2023-10-22 LAB — D-DIMER, QUANTITATIVE: D-Dimer, Quant: <0.27 ug{FEU}/mL (ref 0.00–0.50)

## 2023-10-22 LAB — COMPREHENSIVE METABOLIC PANEL
ALT: 18 U/L (ref 0–44)
AST: 20 U/L (ref 15–41)
Albumin: 3.8 g/dL (ref 3.5–5.0)
Alkaline Phosphatase: 82 U/L (ref 38–126)
Anion gap: 10 (ref 5–15)
BUN: 8 mg/dL (ref 6–20)
CO2: 24 mmol/L (ref 22–32)
Calcium: 9.2 mg/dL (ref 8.9–10.3)
Chloride: 104 mmol/L (ref 98–111)
Creatinine, Ser: 0.99 mg/dL (ref 0.61–1.24)
GFR, Estimated: >60 mL/min (ref >60)
Glucose, Bld: 117 mg/dL — ABNORMAL HIGH (ref 70–99)
Potassium: 3.5 mmol/L (ref 3.5–5.1)
Sodium: 138 mmol/L (ref 135–145)
Total Bilirubin: 0.5 mg/dL (ref 0.0–1.2)
Total Protein: 7.4 g/dL (ref 6.5–8.1)

## 2023-10-22 LAB — TROPONIN I (HIGH SENSITIVITY): Troponin I (High Sensitivity): 5 ng/L (ref <18)

## 2023-10-22 NOTE — ED Notes (Signed)
Pt states they are leaving to pick up child and will check results online, moved to [facility].

## 2023-10-22 NOTE — ED Triage Notes (Signed)
Pt to ED c/o epigastric pain x 1 day, intermittent in nature. Reports no alleviating or aggravating factors. Denies SHOB.

## 2023-10-22 NOTE — ED Provider Triage Note (Signed)
Emergency Medicine Provider Triage Evaluation Note  Jonathon Stone , a 43 y.o. male  was evaluated in triage.  Pt complains of chest pain.  Review of Systems  Positive:  Negative:   Physical Exam  BP (!) 135/100 (BP Location: Right Arm)   Pulse (!) 111   Temp 98.5 F (36.9 C) (Oral)   Resp (!) 24   Ht 5\' 9"  (1.753 m)   Wt 59 kg   SpO2 100%   BMI 19.20 kg/m  Gen:   Awake, no distress   Resp:  Normal effort  MSK:   Moves extremities without difficulty  Other:    Medical Decision Making  Medically screening exam initiated at 3:39 PM.  Appropriate orders placed.  Jonathon Stone was informed that the remainder of the evaluation will be completed by another provider, this initial triage assessment does not replace that evaluation, and the importance of remaining in the ED until their evaluation is complete.  Intermittent epigastric/left sided chest pain since yesterday. Also with nausea. Denies recent trauma or lifting heavy objects.  Denies fever, dyspnea, cough, vomiting, diarrhea.   Jonathon Stone, [place] 10/22/23 1540

## 2024-06-15 ENCOUNTER — Encounter (HOSPITAL_COMMUNITY): Payer: Self-pay

## 2024-06-15 ENCOUNTER — Other Ambulatory Visit: Payer: Self-pay

## 2024-06-15 ENCOUNTER — Emergency Department (HOSPITAL_COMMUNITY)
Admission: EM | Admit: 2024-06-15 | Discharge: 2024-06-16 | Discharge status: Against Medical Advice | Attending: Emergency Medicine | Admitting: Emergency Medicine

## 2024-06-15 ENCOUNTER — Ambulatory Visit (HOSPITAL_COMMUNITY)
Admission: EM | Admit: 2024-06-15 | Discharge: 2024-06-15 | Disposition: A | Discharge status: Home / Self Care | Attending: Family Medicine | Admitting: Family Medicine

## 2024-06-15 DIAGNOSIS — F1510 Other stimulant abuse, uncomplicated: Secondary | ICD-10-CM | POA: Diagnosis not present

## 2024-06-15 DIAGNOSIS — R079 Chest pain, unspecified: Secondary | ICD-10-CM | POA: Diagnosis not present

## 2024-06-15 DIAGNOSIS — R1010 Upper abdominal pain, unspecified: Secondary | ICD-10-CM | POA: Diagnosis present

## 2024-06-15 DIAGNOSIS — Z5321 Procedure and treatment not carried out due to patient leaving prior to being seen by health care provider: Secondary | ICD-10-CM | POA: Diagnosis not present

## 2024-06-15 DIAGNOSIS — F1210 Cannabis abuse, uncomplicated: Secondary | ICD-10-CM | POA: Insufficient documentation

## 2024-06-15 DIAGNOSIS — F17210 Nicotine dependence, cigarettes, uncomplicated: Secondary | ICD-10-CM | POA: Diagnosis not present

## 2024-06-15 LAB — URINALYSIS, ROUTINE W REFLEX MICROSCOPIC
Bilirubin Urine: NEGATIVE
Glucose, UA: NEGATIVE mg/dL
Ketones, ur: NEGATIVE mg/dL
Nitrite: NEGATIVE
Protein, ur: 30 mg/dL — AB
Specific Gravity, Urine: 1.015 (ref 1.005–1.030)
WBC, UA: >50 WBC/hpf (ref 0–5)
pH: 6 (ref 5.0–8.0)

## 2024-06-15 LAB — RAPID URINE DRUG SCREEN, HOSP PERFORMED
Amphetamines: POSITIVE — AB
Barbiturates: NOT DETECTED
Benzodiazepines: NOT DETECTED
Cocaine: NOT DETECTED
Opiates: NOT DETECTED
Tetrahydrocannabinol: POSITIVE — AB

## 2024-06-15 LAB — CBC WITH DIFFERENTIAL/PLATELET
Abs Immature Granulocytes: 0.01 K/uL (ref 0.00–0.07)
Basophils Absolute: 0 K/uL (ref 0.0–0.1)
Basophils Relative: 0 %
Eosinophils Absolute: 0.2 K/uL (ref 0.0–0.5)
Eosinophils Relative: 2 %
HCT: 40.3 % (ref 39.0–52.0)
Hemoglobin: 12.7 g/dL — ABNORMAL LOW (ref 13.0–17.0)
Immature Granulocytes: 0 %
Lymphocytes Relative: 43 %
Lymphs Abs: 3.7 K/uL (ref 0.7–4.0)
MCH: 27.1 pg (ref 26.0–34.0)
MCHC: 31.5 g/dL (ref 30.0–36.0)
MCV: 85.9 fL (ref 80.0–100.0)
Monocytes Absolute: 0.8 K/uL (ref 0.1–1.0)
Monocytes Relative: 10 %
Neutro Abs: 3.9 K/uL (ref 1.7–7.7)
Neutrophils Relative %: 45 %
Platelets: 564 K/uL — ABNORMAL HIGH (ref 150–400)
RBC: 4.69 MIL/uL (ref 4.22–5.81)
RDW: 14.9 % (ref 11.5–15.5)
WBC: 8.7 K/uL (ref 4.0–10.5)
nRBC: 0 % (ref 0.0–0.2)

## 2024-06-15 LAB — COMPREHENSIVE METABOLIC PANEL WITH GFR
ALT: 17 U/L (ref 0–44)
AST: 17 U/L (ref 15–41)
Albumin: 4 g/dL (ref 3.5–5.0)
Alkaline Phosphatase: 69 U/L (ref 38–126)
Anion gap: 9 (ref 5–15)
BUN: 10 mg/dL (ref 6–20)
CO2: 26 mmol/L (ref 22–32)
Calcium: 9.6 mg/dL (ref 8.9–10.3)
Chloride: 104 mmol/L (ref 98–111)
Creatinine, Ser: 0.89 mg/dL (ref 0.61–1.24)
GFR, Estimated: >60 mL/min (ref >60)
Glucose, Bld: 80 mg/dL (ref 70–99)
Potassium: 3.8 mmol/L (ref 3.5–5.1)
Sodium: 139 mmol/L (ref 135–145)
Total Bilirubin: 0.5 mg/dL (ref 0.0–1.2)
Total Protein: 7.6 g/dL (ref 6.5–8.1)

## 2024-06-15 LAB — LIPASE, BLOOD: Lipase: 53 U/L — ABNORMAL HIGH (ref 11–51)

## 2024-06-15 LAB — TSH: TSH: 2.044 u[IU]/mL (ref 0.350–4.500)

## 2024-06-15 NOTE — ED Notes (Signed)
 Patient is being discharged from the Urgent Care and sent to the Emergency Department via private vehicle . Per Dr. Rolinda, patient is in need of higher level of care due to chest pain. Patient is aware and verbalizes understanding of plan of care.  Vitals:   06/15/24 0842  BP: (!) 129/99  Pulse: (!) 101  Resp: 16  Temp: 98.2 F (36.8 C)  SpO2: 100%

## 2024-06-15 NOTE — ED Triage Notes (Signed)
 Patient reports he has a bulge that pops out in his upper stomach region and a fullness in his throat.  Reports it happened 2 times yesterday and again today and was sent here by UC.

## 2024-06-15 NOTE — ED Provider Notes (Addendum)
 [facility]   [id number] 06/15/24 Arrival Time: 9166  ASSESSMENT & PLAN:  1. Chest pain, unspecified type   2. Cigarette smoker   3. Methamphetamine abuse (HCC)    Lower sternal CP and L-sided neck pain; x several episodes; first one yesterday morning around 6 am with diaphoresis but without SOB/n/v. Two episodes yesterday; one this morning; same symptoms. 26 pack year smoking history. Last smoked meth 5 days ago.  ECG: Performed today and interpreted by me: sinus tachycardia; no STEMI.  To ED for further evaluation. No active CP at this time. Declines EMS transport; his sister to drive him now. Stable upon discharge.  Reviewed expectations re: course of current medical issues. Questions answered. Outlined signs and symptoms indicating need for more acute intervention. Patient verbalized understanding. After Visit Summary given.   SUBJECTIVE:  History from: patient. Jonathon Stone is a 43 y.o. male who presents with complaint of lower sternal and L-sided neck pain; first episode yesterday morning; with diaphoresis but without SOB/n/v. Same symptoms this morning after two episodes yesterday afternoon. Denies current pain but did reports 2/10 CP upon arrival here this morning. Cigarette smoker. Smokes meth also; last use 5 days ago.  Describes feeling like there's something coming out of my chest and my neck when he has pain.   Social History   Tobacco Use  Smoking Status Every Day   Current packs/day: 0.50   Average packs/day: 0.5 packs/day for 23.0 years (11.5 ttl pk-yrs)   Types: Cigarettes  Smokeless Tobacco Never   Social History   Substance and Sexual Activity  Alcohol Use Not Currently    OBJECTIVE:  Vitals:   06/15/24 0842  BP: (!) 129/99  Pulse: (!) 101  Resp: 16  Temp: 98.2 F (36.8 C)  TempSrc: Oral  SpO2: 100%    General appearance: alert, oriented, no acute distress Eyes: PERRLA; EOMI; conjunctivae normal HENT:  normocephalic; atraumatic Neck: supple with FROM; without masses Lungs: without labored respirations; speaks full sentences without difficulty; CTAB Heart: regular rate and rhythm without murmer Chest Wall: without tenderness to palpation Abdomen: soft, non-tender; no guarding or rebound tenderness Extremities: without edema; without calf swelling or tenderness; symmetrical without gross deformities Skin: warm and dry; without rash or lesions Neuro: normal gait Psychological: alert and cooperative; normal mood and affect    Allergies  Allergen Reactions   Oxycodone  Other (See Comments)    Hallucinations and vomiting    Past Medical History:  Diagnosis Date   Abscess 09/2020   left buttock   Smoker    Social History   Socioeconomic History   Marital status: Divorced    Spouse name: Not on file   Number of children: Not on file   Years of education: Not on file   Highest education level: Not on file  Occupational History   Not on file  Tobacco Use   Smoking status: Every Day    Current packs/day: 0.50    Average packs/day: 0.5 packs/day for 23.0 years (11.5 ttl pk-yrs)    Types: Cigarettes   Smokeless tobacco: Never  Vaping Use   Vaping status: Never Used  Substance and Sexual Activity   Alcohol use: Not Currently   Drug use: Yes    Types: Marijuana   Sexual activity: Not on file  Other Topics Concern   Not on file  Social History Narrative   Lives with son.  Works in Corporate investment banker down town, Merchant navy officer, [employer].   Exercise - not  much.   02/2021   Social Drivers of Health   Financial Resource Strain: Not on file  Food Insecurity: Not on file  Transportation Needs: Not on file  Physical Activity: Not on file  Stress: Not on file  Social Connections: Not on file  Intimate Partner Violence: Not on file   Family History  Problem Relation Age of Onset   Neuropathy Mother    Heart attack Father 54       2 MI   Heart disease Father     Cancer Maternal Grandmother        lung   Cancer Paternal Grandmother        bone   Diabetes Neg Hx    Hypertension Neg Hx    Past Surgical History:  Procedure Laterality Date   IRRIGATION AND DEBRIDEMENT ABSCESS Left 10/21/2020   Procedure: IRRIGATION AND DEBRIDEMENT LEFT BUTTOCKS ABSCESS;  Surgeon: Ebbie Cough, MD;  Location: [facility] OR;  Service: General;  Laterality: Left;      Rolinda Rogue, MD 06/15/24 9087    Rolinda Rogue, MD 06/15/24 [phone]

## 2024-06-15 NOTE — ED Triage Notes (Signed)
 Patient reports that he has had intermittent left chest pain since yesterday and at times it radiates into his left neck. Patient states, I was looking at my chest and it looks like something bulging out.  Patient denies SOB, N/v.

## 2024-06-15 NOTE — ED Provider Triage Note (Signed)
 Emergency Medicine Provider Triage Evaluation Note  Jonathon Stone , a 43 y.o. male  was evaluated in triage.  Pt complains of sensation of fullness in his upper abdomen and throat.  Seems to occur randomly.  Yesterday he noticed a bulge at the apex of his abdominal wall that he was able to push back in with his fingers.  He was seen at urgent care without any formal diagnosis return to urgent care who sent him here today.  Review of Systems  Positive: Sensation of fullness in the throat and abdomen Negative: Active chest pain or shortness of breath  Physical Exam  BP (!) 146/103   Pulse (!) 113   Temp (!) 97.4 F (36.3 C)   Resp 18   SpO2 98%  Gen:   Awake, no distress   Resp:  Normal effort  MSK:   Moves extremities without difficulty  Other:    Medical Decision Making  Medically screening exam initiated at 12:25 PM.  Appropriate orders placed.  Gamal Todisco was informed that the remainder of the evaluation will be completed by another provider, this initial triage assessment does not replace that evaluation, and the importance of remaining in the ED until their evaluation is complete.     Arloa Chroman, PA-C 06/15/24 1228

## 2024-06-15 NOTE — ED Notes (Signed)
 Called pt 4x, and received no response.

## 2024-08-28 ENCOUNTER — Other Ambulatory Visit: Payer: Self-pay | Admitting: Medical

## 2024-08-28 MED ORDER — CLINDAMYCIN PHOSPHATE 1 % EX GEL: CUTANEOUS | 2 refills | Status: AC
